# Patient Record
Sex: Female | Born: 1950 | Race: White | Hispanic: No | Marital: Married | Smoking: Former smoker
Health system: Southern US, Community
[De-identification: ages and names within clinical notes are randomized; demographics above are authoritative.]

## PROBLEM LIST (undated history)

## (undated) DIAGNOSIS — E049 Nontoxic goiter, unspecified: Secondary | ICD-10-CM

## (undated) DIAGNOSIS — N952 Postmenopausal atrophic vaginitis: Secondary | ICD-10-CM

## (undated) DIAGNOSIS — D219 Benign neoplasm of connective and other soft tissue, unspecified: Secondary | ICD-10-CM

## (undated) DIAGNOSIS — C801 Malignant (primary) neoplasm, unspecified: Secondary | ICD-10-CM

## (undated) DIAGNOSIS — N83209 Unspecified ovarian cyst, unspecified side: Secondary | ICD-10-CM

## (undated) HISTORY — DX: Benign neoplasm of connective and other soft tissue, unspecified: D21.9

## (undated) HISTORY — DX: Postmenopausal atrophic vaginitis: N95.2

## (undated) HISTORY — DX: Nontoxic goiter, unspecified: E04.9

## (undated) HISTORY — DX: Malignant (primary) neoplasm, unspecified: C80.1

## (undated) HISTORY — DX: Unspecified ovarian cyst, unspecified side: N83.209

## (undated) HISTORY — PX: OTHER SURGICAL HISTORY: SHX169

## (undated) HISTORY — PX: MOHS SURGERY: SUR867

---

## 1993-05-08 HISTORY — PX: OVARIAN CYST REMOVAL: SHX89

## 1993-05-08 HISTORY — PX: TOTAL ABDOMINAL HYSTERECTOMY: SHX209

## 1996-05-08 HISTORY — PX: OTHER SURGICAL HISTORY: SHX169

## 1997-11-04 ENCOUNTER — Ambulatory Visit (HOSPITAL_COMMUNITY): Admission: RE | Admit: 1997-11-04 | Discharge: 1997-11-04 | Payer: Self-pay | Admitting: Neurology

## 1998-07-21 ENCOUNTER — Other Ambulatory Visit: Admission: RE | Admit: 1998-07-21 | Discharge: 1998-07-21 | Payer: Self-pay | Admitting: Obstetrics and Gynecology

## 1998-08-15 ENCOUNTER — Emergency Department (HOSPITAL_COMMUNITY): Admission: EM | Admit: 1998-08-15 | Discharge: 1998-08-15 | Payer: Self-pay | Admitting: Internal Medicine

## 1999-06-28 ENCOUNTER — Other Ambulatory Visit: Admission: RE | Admit: 1999-06-28 | Discharge: 1999-06-28 | Payer: Self-pay | Admitting: Obstetrics and Gynecology

## 2000-06-11 ENCOUNTER — Other Ambulatory Visit: Admission: RE | Admit: 2000-06-11 | Discharge: 2000-06-11 | Payer: Self-pay | Admitting: Obstetrics and Gynecology

## 2001-06-11 ENCOUNTER — Other Ambulatory Visit: Admission: RE | Admit: 2001-06-11 | Discharge: 2001-06-11 | Payer: Self-pay | Admitting: Obstetrics and Gynecology

## 2002-07-08 ENCOUNTER — Other Ambulatory Visit: Admission: RE | Admit: 2002-07-08 | Discharge: 2002-07-08 | Payer: Self-pay | Admitting: Obstetrics and Gynecology

## 2003-07-21 ENCOUNTER — Other Ambulatory Visit: Admission: RE | Admit: 2003-07-21 | Discharge: 2003-07-21 | Payer: Self-pay | Admitting: Obstetrics and Gynecology

## 2004-07-27 ENCOUNTER — Other Ambulatory Visit: Admission: RE | Admit: 2004-07-27 | Discharge: 2004-07-27 | Payer: Self-pay | Admitting: Obstetrics and Gynecology

## 2004-09-07 ENCOUNTER — Ambulatory Visit (HOSPITAL_COMMUNITY): Admission: RE | Admit: 2004-09-07 | Discharge: 2004-09-07 | Payer: Self-pay | Admitting: Endocrinology

## 2004-09-20 ENCOUNTER — Encounter (HOSPITAL_COMMUNITY): Admission: RE | Admit: 2004-09-20 | Discharge: 2004-12-19 | Payer: Self-pay | Admitting: Endocrinology

## 2004-09-28 ENCOUNTER — Encounter: Admission: RE | Admit: 2004-09-28 | Discharge: 2004-09-28 | Payer: Self-pay | Admitting: Endocrinology

## 2004-09-28 ENCOUNTER — Other Ambulatory Visit: Admission: RE | Admit: 2004-09-28 | Discharge: 2004-09-28 | Payer: Self-pay | Admitting: Interventional Radiology

## 2004-09-28 ENCOUNTER — Encounter (INDEPENDENT_AMBULATORY_CARE_PROVIDER_SITE_OTHER): Payer: Self-pay | Admitting: Specialist

## 2005-04-03 ENCOUNTER — Ambulatory Visit (HOSPITAL_COMMUNITY): Admission: RE | Admit: 2005-04-03 | Discharge: 2005-04-03 | Payer: Self-pay | Admitting: Gastroenterology

## 2005-04-10 ENCOUNTER — Ambulatory Visit (HOSPITAL_COMMUNITY): Admission: RE | Admit: 2005-04-10 | Discharge: 2005-04-10 | Payer: Self-pay | Admitting: Endocrinology

## 2005-05-08 HISTORY — PX: THYROIDECTOMY: SHX17

## 2005-08-01 ENCOUNTER — Other Ambulatory Visit: Admission: RE | Admit: 2005-08-01 | Discharge: 2005-08-01 | Payer: Self-pay | Admitting: Obstetrics and Gynecology

## 2005-08-15 ENCOUNTER — Encounter (INDEPENDENT_AMBULATORY_CARE_PROVIDER_SITE_OTHER): Payer: Self-pay | Admitting: Specialist

## 2005-08-15 ENCOUNTER — Ambulatory Visit (HOSPITAL_COMMUNITY): Admission: RE | Admit: 2005-08-15 | Discharge: 2005-08-16 | Payer: Self-pay | Admitting: Surgery

## 2005-10-17 ENCOUNTER — Encounter: Admission: RE | Admit: 2005-10-17 | Discharge: 2005-10-17 | Payer: Self-pay | Admitting: Endocrinology

## 2005-10-26 ENCOUNTER — Encounter: Admission: RE | Admit: 2005-10-26 | Discharge: 2005-10-26 | Payer: Self-pay | Admitting: Endocrinology

## 2005-11-03 ENCOUNTER — Encounter: Admission: RE | Admit: 2005-11-03 | Discharge: 2005-11-03 | Payer: Self-pay | Admitting: Endocrinology

## 2006-08-03 ENCOUNTER — Other Ambulatory Visit: Admission: RE | Admit: 2006-08-03 | Discharge: 2006-08-03 | Payer: Self-pay | Admitting: Obstetrics and Gynecology

## 2006-11-21 ENCOUNTER — Encounter: Admission: RE | Admit: 2006-11-21 | Discharge: 2006-11-21 | Payer: Self-pay | Admitting: Endocrinology

## 2007-08-06 ENCOUNTER — Other Ambulatory Visit: Admission: RE | Admit: 2007-08-06 | Discharge: 2007-08-06 | Payer: Self-pay | Admitting: Obstetrics and Gynecology

## 2007-11-11 ENCOUNTER — Encounter: Admission: RE | Admit: 2007-11-11 | Discharge: 2007-11-11 | Payer: Self-pay | Admitting: Endocrinology

## 2007-11-12 ENCOUNTER — Encounter: Admission: RE | Admit: 2007-11-12 | Discharge: 2007-11-12 | Payer: Self-pay | Admitting: Endocrinology

## 2007-11-13 ENCOUNTER — Encounter: Admission: RE | Admit: 2007-11-13 | Discharge: 2007-11-13 | Payer: Self-pay | Admitting: Endocrinology

## 2008-08-11 ENCOUNTER — Ambulatory Visit: Payer: Self-pay | Admitting: Obstetrics and Gynecology

## 2008-08-11 ENCOUNTER — Other Ambulatory Visit: Admission: RE | Admit: 2008-08-11 | Discharge: 2008-08-11 | Payer: Self-pay | Admitting: Obstetrics and Gynecology

## 2008-08-11 ENCOUNTER — Encounter: Payer: Self-pay | Admitting: Obstetrics and Gynecology

## 2008-11-11 ENCOUNTER — Ambulatory Visit: Payer: Self-pay | Admitting: Obstetrics and Gynecology

## 2008-12-07 ENCOUNTER — Ambulatory Visit (HOSPITAL_COMMUNITY): Admission: RE | Admit: 2008-12-07 | Discharge: 2008-12-07 | Payer: Self-pay | Admitting: Obstetrics and Gynecology

## 2009-08-12 ENCOUNTER — Other Ambulatory Visit: Admission: RE | Admit: 2009-08-12 | Discharge: 2009-08-12 | Payer: Self-pay | Admitting: Obstetrics and Gynecology

## 2009-08-12 ENCOUNTER — Ambulatory Visit: Payer: Self-pay | Admitting: Obstetrics and Gynecology

## 2009-11-23 ENCOUNTER — Ambulatory Visit: Payer: Self-pay | Admitting: Obstetrics and Gynecology

## 2009-12-15 ENCOUNTER — Ambulatory Visit (HOSPITAL_COMMUNITY): Admission: RE | Admit: 2009-12-15 | Discharge: 2009-12-15 | Payer: Self-pay | Admitting: Obstetrics and Gynecology

## 2010-03-21 ENCOUNTER — Encounter (HOSPITAL_COMMUNITY)
Admission: RE | Admit: 2010-03-21 | Discharge: 2010-06-07 | Payer: Self-pay | Source: Home / Self Care | Attending: Endocrinology | Admitting: Endocrinology

## 2010-05-29 ENCOUNTER — Encounter: Payer: Self-pay | Admitting: Endocrinology

## 2010-09-02 ENCOUNTER — Encounter: Payer: Self-pay | Admitting: *Deleted

## 2010-09-02 DIAGNOSIS — N952 Postmenopausal atrophic vaginitis: Secondary | ICD-10-CM

## 2010-09-02 DIAGNOSIS — M810 Age-related osteoporosis without current pathological fracture: Secondary | ICD-10-CM | POA: Insufficient documentation

## 2010-09-05 ENCOUNTER — Encounter (INDEPENDENT_AMBULATORY_CARE_PROVIDER_SITE_OTHER): Payer: 59 | Admitting: Obstetrics and Gynecology

## 2010-09-05 ENCOUNTER — Other Ambulatory Visit (HOSPITAL_COMMUNITY)
Admission: RE | Admit: 2010-09-05 | Discharge: 2010-09-05 | Disposition: A | Discharge status: Home / Self Care | Payer: 59 | Source: Ambulatory Visit | Attending: Obstetrics and Gynecology | Admitting: Obstetrics and Gynecology

## 2010-09-05 ENCOUNTER — Other Ambulatory Visit: Payer: Self-pay | Admitting: Obstetrics and Gynecology

## 2010-09-05 DIAGNOSIS — Z124 Encounter for screening for malignant neoplasm of cervix: Secondary | ICD-10-CM | POA: Insufficient documentation

## 2010-09-05 DIAGNOSIS — E0590 Thyrotoxicosis, unspecified without thyrotoxic crisis or storm: Secondary | ICD-10-CM

## 2010-09-05 DIAGNOSIS — B373 Candidiasis of vulva and vagina: Secondary | ICD-10-CM

## 2010-09-05 DIAGNOSIS — Z01419 Encounter for gynecological examination (general) (routine) without abnormal findings: Secondary | ICD-10-CM

## 2010-09-05 DIAGNOSIS — Z13220 Encounter for screening for lipoid disorders: Secondary | ICD-10-CM

## 2010-09-06 ENCOUNTER — Encounter: Payer: Self-pay | Admitting: *Deleted

## 2010-09-23 NOTE — Op Note (Signed)
NAMEPeola, [name]                  ACCOUNT NO.:  [id number]   MEDICAL RECORD NO.:  [id number]          PATIENT TYPE:  OIB   LOCATION:  5739                         FACILITY:  [facility]   PHYSICIAN:  Velora Heckler, MD      DATE OF BIRTH:  [birth year]   DATE OF PROCEDURE:  08/15/2005  DATE OF DISCHARGE:                                 OPERATIVE REPORT   PREOPERATIVE DIAGNOSIS:  Thyroid goiter with compressive symptoms, dominant  left thyroid nodule.   POSTOPERATIVE DIAGNOSIS:  Thyroid goiter with compressive symptoms, dominant  left thyroid nodule.   PROCEDURE:  Total thyroidectomy.   SURGEON:  Velora Heckler, M.D., FACS   ASSISTANT:  Anselm Pancoast. Zachery Dakins, M.D., FACS   ANESTHESIA:  General per Dr. Kipp Brood   ESTIMATED BLOOD LOSS:  Minimal.   PREPARATION:  Betadine.   COMPLICATIONS:  None.   INDICATIONS:  The patient is a 60 year old white female from [place],  [place], referred by Dr. Dorisann Frames with thyroid goiter.  The  patient had been diagnosed in March 2006 by Dr. Edyth Gunnels.  The  patient had undergone a thorough workup.  The patient had continued  enlargement of the left thyroid lobe with development of compressive  symptoms.  She now comes to surgery for thyroidectomy.   DESCRIPTION OF PROCEDURE:  The procedure was done in OR2 in the  neurosurgical operating theater at [facility].  The patient is  brought to the operating room and placed in a supine position on the  operating room table.  Following the administration of general anesthesia,  the patient is positioned and then prepped and draped in the usual strict  aseptic fashion.  After ascertaining that an adequate level of anesthesia  been obtained, a Kocher incision was made with a #15 blade.  Dissection was  carried down through subcutaneous tissues and platysma.  Hemostasis was  obtained with electrocautery.  Skin flaps were developed cephalad and caudad  from the thyroid notch to  the sternal notch.  There is marked deviation of  the airway and larynx to the right due to the large mass in the left thyroid  bed.  A Mahorner self-retaining retractor was placed for exposure.  Strap  muscles were incised in the midline and dissection was begun on the left  side.  The strap muscles were reflected laterally.  The left thyroid lobe  was exposed.  It is quite large measuring at least 7-8 cm in greatest  dimension.  The superior pole vessels are ligated in continuity with 2-0  silk ties and medium Liga clips and divided.  The gland is rolled medially.  Venous tributaries were divided between medium Liga clips and ligated in  continuity with 2-0 silk ties and divided.  The gland is rolled further  anteriorly.  The recurrent nerve was identified and preserved.  The branches  of the inferior thyroid artery were divided between small and medium Liga  clips.  The ligament of Allyson Sabal is transected with the electrocautery and the  gland is rolled anteriorly up and onto the  anterior trachea.  A dry pack is  placed in the left neck with good hemostasis noted.   Next, we turned our attention to the right lobe.  The right lobe of the  gland was markedly smaller.  It is without significant nodularity.  The  strap muscles were reflected laterally.  The superior pole was dissected  out, the superior pole vessels were ligated in continuity with 2-0 silk ties  and medium Liga clips and divided.  Superior and inferior parathyroid glands  were identified on the right and preserved.  The recurrent nerve was  identified on the right and preserved.  The branches of the inferior thyroid  artery were divided between small hemoclips.  The inferior venous  tributaries were divided between medium hemoclips.  The gland is rolled  anteriorly.  The ligament of Allyson Sabal was transected with the electrocautery.  The gland was dissected off the anterior trachea and the entire specimen  excised.  It is submitted  to pathology.  The sutures were used to mark the  right thyroid lobe.  The neck is irrigated with warm saline which was  evacuated.  All packs were removed.  Hemostasis was obtained with small and  medium hemoclips.  Surgicel was placed over the area of the recurrent nerves  bilaterally.  The strap muscles were reapproximated in the midline with  interrupted 3-0 Vicryl sutures.  The platysma was closed with interrupted 3-  0 Vicryl sutures.  The skin was reapproximated with a running 4-0 Vicryl  subcuticular suture.  The wound is washed and dried and Benzoin and Steri-  Strips were applied.  Sterile dressings were applied.  The patient is  awakened from anesthesia and brought to the recovery room in stable  condition.  The patient tolerated the procedure well.      Velora Heckler, MD  Electronically Signed     TMG/MEDQ  D:  08/15/2005  T:  08/15/2005  Job:  [id number]   cc:   Reuel Boom L. Eda Paschal, M.D.  Fax: [phone]   Dorisann Frames, M.D.  Fax: [phone]

## 2010-09-23 NOTE — Op Note (Signed)
NAMEMileidy Davila, [name]                  ACCOUNT NO.:  [id number]   MEDICAL RECORD NO.:  [id number]          PATIENT TYPE:  AMB   LOCATION:  ENDO                         FACILITY:  [facility]   PHYSICIAN:  Anselmo Rod, M.D.  DATE OF BIRTH:  07-29-1950   DATE OF PROCEDURE:  DATE OF DISCHARGE:                                 OPERATIVE REPORT   Audio too short to transcribe (less than 5 seconds)      Anselmo Rod, M.D.     JNM/MEDQ  D:  04/03/2005  T:  04/03/2005  Job:  [id number]

## 2010-09-23 NOTE — Op Note (Signed)
NAMEChrystel, Barefield Muskan                  ACCOUNT NO.:  [id number]   MEDICAL RECORD NO.:  [id number]          PATIENT TYPE:  AMB   LOCATION:  ENDO                         FACILITY:  [facility]   PHYSICIAN:  Anselmo Rod, M.D.  DATE OF BIRTH:  [birth year]   DATE OF PROCEDURE:  04/03/2005  DATE OF DISCHARGE:                                 OPERATIVE REPORT   PROCEDURE PERFORMED:  Screening coloscopy.   ENDOSCOPIST:  Anselmo Rod, M.D.   INSTRUMENT USED:  Olympus video colonoscope.   INDICATIONS FOR PROCEDURE:  A 60 year old white female undergoing a  screening coloscopy to rule out colonic polyps, masses, etc.  The patient  has a family history of colon cancer in her father who died of it at age 72.   PRE-PROCEDURE PREPARATION:  Informed consent was procured from the patient.  The patient was fasted for four hours prior to the procedure and prepped  with OsmoPrep pills the night prior to and the morning of the procedure.  The risks and benefits of the procedure including a 10% mis-read of cancer  and polyps was discussed with the patient as well.   PRE-PROCEDURE PHYSICAL:  The patient had stable vital signs.  Neck supple.  Chest clear to auscultation.  S1 S2 regular.  Abdomen soft with normal bowel  sounds.   DESCRIPTION OF THE PROCEDURE:  The patient was placed in the left lateral  decubitus position and sedated with 75 mg of Demerol and 5 mg of Versed in  slow incremental doses.  Once the patient was adequately sedated and  maintained on low flow oxygen and continuous cardiac monitoring, the Olympus  video colonoscope was advanced from the rectum to the cecum.  There was  residual stool in the colon.  Multiple washes were done.  The appendiceal  orifice and ileocecal valve were clearly visualized and photographed.  No  masses, polyps, erosions, ulcerations or diverticula were seen.  Retroflexion in the rectum revealed no abnormalities.  The patient tolerated  the procedure well  without complications.   IMPRESSION:  Normal colonoscopy to the terminal ileum.  No masses, polyps or  diverticula seen.   RECOMMENDATIONS:  1.  Continue a high fiber diet with liberal fluid intake.  2.  Repeat colonoscopy in the next five years unless the patient develops      abnormal symptoms.  3.  The patient will follow as the need arises in the future.      Anselmo Rod, M.D.  Electronically Signed     JNM/MEDQ  D:  04/03/2005  T:  04/03/2005  Job:  [id number]   cc:   Reuel Boom L. Eda Paschal, M.D.  Fax: [phone]

## 2010-12-06 ENCOUNTER — Telehealth: Payer: Self-pay | Admitting: *Deleted

## 2010-12-06 DIAGNOSIS — M810 Age-related osteoporosis without current pathological fracture: Secondary | ICD-10-CM

## 2010-12-06 NOTE — Telephone Encounter (Signed)
[name]. Would be a solution for her please check benefits.

## 2010-12-06 NOTE — Telephone Encounter (Signed)
Called patient and LM stating ok to check benefits for Prolia.  Will process and call patient back when benefits are received.

## 2010-12-06 NOTE — Telephone Encounter (Signed)
Called patient to give her the benefits for Reclast.  She had new insurance and it would not pay anything.  Everything would go toward deductable.  Patient asked if there may be a cheaper option.  I mentioned Prolia to her and she would like to know if this would be an option for her? If so, we can check benefits for her on that.

## 2010-12-14 NOTE — Telephone Encounter (Signed)
Patient was informed about Prolia benefits. She is covered with a $25 copay.  We will schedule her labs, order Prolia, and call patient when results are in and injection arrives.

## 2010-12-15 ENCOUNTER — Other Ambulatory Visit: Payer: BC Managed Care – PPO | Admitting: *Deleted

## 2010-12-15 DIAGNOSIS — M810 Age-related osteoporosis without current pathological fracture: Secondary | ICD-10-CM

## 2010-12-15 LAB — CALCIUM: Calcium: 10 mg/dL (ref 8.4–10.5)

## 2010-12-15 LAB — CREATININE, SERUM: Creat: 0.83 mg/dL (ref 0.50–1.10)

## 2010-12-19 ENCOUNTER — Telehealth: Payer: Self-pay | Admitting: *Deleted

## 2010-12-19 ENCOUNTER — Other Ambulatory Visit: Payer: Self-pay | Admitting: *Deleted

## 2010-12-19 DIAGNOSIS — M810 Age-related osteoporosis without current pathological fracture: Secondary | ICD-10-CM

## 2010-12-19 MED ORDER — DENOSUMAB 60 MG/ML ~~LOC~~ SOLN
60.0000 mg | Freq: Once | SUBCUTANEOUS | Status: AC
  Administered 2010-12-20: 60 mg via SUBCUTANEOUS

## 2010-12-19 NOTE — Telephone Encounter (Signed)
Called patient and told her that her labs were normal, and we will proceed with Prolia injection at a $25 copay.

## 2010-12-20 ENCOUNTER — Ambulatory Visit (INDEPENDENT_AMBULATORY_CARE_PROVIDER_SITE_OTHER): Payer: BC Managed Care – PPO | Admitting: Gynecology

## 2010-12-20 DIAGNOSIS — M810 Age-related osteoporosis without current pathological fracture: Secondary | ICD-10-CM

## 2011-06-20 ENCOUNTER — Telehealth: Payer: Self-pay | Admitting: *Deleted

## 2011-06-20 NOTE — Telephone Encounter (Signed)
Lm for patient to call.  Time for Prolia injection.  Want to make sure patient still interested before ordering

## 2011-06-21 ENCOUNTER — Other Ambulatory Visit: Payer: Self-pay | Admitting: *Deleted

## 2011-06-21 DIAGNOSIS — M810 Age-related osteoporosis without current pathological fracture: Secondary | ICD-10-CM

## 2011-06-21 MED ORDER — DENOSUMAB 60 MG/ML ~~LOC~~ SOLN: 60.0000 mg | Freq: Once | SUBCUTANEOUS | Status: DC

## 2011-06-21 NOTE — Telephone Encounter (Signed)
Patient will come for Prolia.  Ordered today and will get injection on 06/28/11.

## 2011-06-28 ENCOUNTER — Ambulatory Visit (INDEPENDENT_AMBULATORY_CARE_PROVIDER_SITE_OTHER): Payer: BC Managed Care – PPO | Admitting: Anesthesiology

## 2011-06-28 DIAGNOSIS — M810 Age-related osteoporosis without current pathological fracture: Secondary | ICD-10-CM

## 2011-06-28 MED ORDER — DENOSUMAB 60 MG/ML ~~LOC~~ SOLN
60.0000 mg | Freq: Once | SUBCUTANEOUS | Status: AC
  Administered 2011-06-28: 60 mg via SUBCUTANEOUS

## 2011-09-04 ENCOUNTER — Encounter: Payer: Self-pay | Admitting: Gynecology

## 2011-09-04 DIAGNOSIS — N83209 Unspecified ovarian cyst, unspecified side: Secondary | ICD-10-CM | POA: Insufficient documentation

## 2011-09-15 ENCOUNTER — Encounter: Payer: Self-pay | Admitting: Obstetrics and Gynecology

## 2011-09-15 ENCOUNTER — Ambulatory Visit (INDEPENDENT_AMBULATORY_CARE_PROVIDER_SITE_OTHER): Payer: BC Managed Care – PPO | Admitting: Obstetrics and Gynecology

## 2011-09-15 VITALS — BP 124/72 | Ht 61.5 in | Wt 136.0 lb

## 2011-09-15 DIAGNOSIS — E049 Nontoxic goiter, unspecified: Secondary | ICD-10-CM

## 2011-09-15 DIAGNOSIS — Z01419 Encounter for gynecological examination (general) (routine) without abnormal findings: Secondary | ICD-10-CM

## 2011-09-15 NOTE — Progress Notes (Addendum)
The patient came to see me today for her annual GYN exam. She does have some hot flashes but she can control them with her diet. She treat her vaginal dryness with estrogen cream. She has a history of osteoporosis. Initially she was treated with Fosamax. She then did 2 years of Reclast. Due to cost  she's now done one-year Prolia. Her last bone density was in 2012 and her worst T score was now -2.1. It showed 10.1% improvement in the spine and 4.7% improvement in the hip. She has had no fractures. She is due for her mammogram next month. She is having no vaginal bleeding. She is having no pelvic pain. Her endocrinologist watches her goiter with Synthroid. Her cholesterol was fine last year.  HEENT: Within normal limits. Beola Cord present. Neck: No masses. Supraclavicular lymph nodes: Not enlarged. Breasts: Examined in both sitting and lying position. Symmetrical without skin changes or masses. Abdomen: Soft no masses guarding or rebound. No hernias. Pelvic: External within normal limits. BUS within normal limits. Vaginal examination shows good estrogen effect, no cystocele enterocele or rectocele. Cervix and uterus absent. Adnexa within normal limits. Rectovaginal confirmatory. Extremities within normal limits.  Assessment: #1. Atrophic vaginitis #2. Mild menopausal symptoms #3. Osteoporosis now improved  #4. Goiter on Synthroid.  Plan: We discussed some of the new menopausal drugs. For the moment she'll just continue estrogen cream. We discussed pros and cons of continuing Prolia. We have elected to do one more year. We will then get a bone density next year. Mammogram in June.

## 2011-09-20 ENCOUNTER — Other Ambulatory Visit: Payer: Self-pay | Admitting: *Deleted

## 2011-09-20 MED ORDER — HYDROCHLOROTHIAZIDE 25 MG PO TABS: 25.0000 mg | ORAL_TABLET | Freq: Every day | ORAL | Status: DC

## 2011-10-19 ENCOUNTER — Encounter: Payer: Self-pay | Admitting: Obstetrics and Gynecology

## 2011-12-25 ENCOUNTER — Telehealth: Payer: Self-pay | Admitting: *Deleted

## 2011-12-25 NOTE — Telephone Encounter (Signed)
Contacted patient about Prolia being due.  Her last calcium was checked August 2012.  Do you want [place] to check Calcium before injection?

## 2011-12-25 NOTE — Telephone Encounter (Signed)
no

## 2011-12-26 ENCOUNTER — Other Ambulatory Visit: Payer: Self-pay | Admitting: *Deleted

## 2011-12-26 DIAGNOSIS — M810 Age-related osteoporosis without current pathological fracture: Secondary | ICD-10-CM

## 2011-12-26 MED ORDER — DENOSUMAB 60 MG/ML ~~LOC~~ SOLN
60.0000 mg | Freq: Once | SUBCUTANEOUS | Status: AC
  Administered 2012-01-01: 60 mg via SUBCUTANEOUS

## 2011-12-26 NOTE — Telephone Encounter (Signed)
Lm for patient.  Informed dont need labs done.  Ordered Prolia and will set up injection time.

## 2012-01-01 ENCOUNTER — Ambulatory Visit (INDEPENDENT_AMBULATORY_CARE_PROVIDER_SITE_OTHER): Payer: BC Managed Care – PPO | Admitting: Gynecology

## 2012-01-01 DIAGNOSIS — M810 Age-related osteoporosis without current pathological fracture: Secondary | ICD-10-CM

## 2012-05-19 ENCOUNTER — Other Ambulatory Visit: Payer: Self-pay | Admitting: Obstetrics and Gynecology

## 2012-05-21 ENCOUNTER — Other Ambulatory Visit: Payer: Self-pay | Admitting: Obstetrics and Gynecology

## 2012-09-16 ENCOUNTER — Encounter: Payer: Self-pay | Admitting: Gynecology

## 2012-09-16 ENCOUNTER — Ambulatory Visit (INDEPENDENT_AMBULATORY_CARE_PROVIDER_SITE_OTHER): Payer: BC Managed Care – PPO | Admitting: Gynecology

## 2012-09-16 VITALS — BP 112/78 | Ht 61.5 in | Wt 138.0 lb

## 2012-09-16 DIAGNOSIS — IMO0002 Reserved for concepts with insufficient information to code with codable children: Secondary | ICD-10-CM

## 2012-09-16 DIAGNOSIS — Z85850 Personal history of malignant neoplasm of thyroid: Secondary | ICD-10-CM

## 2012-09-16 DIAGNOSIS — Z8639 Personal history of other endocrine, nutritional and metabolic disease: Secondary | ICD-10-CM

## 2012-09-16 DIAGNOSIS — M810 Age-related osteoporosis without current pathological fracture: Secondary | ICD-10-CM

## 2012-09-16 DIAGNOSIS — N952 Postmenopausal atrophic vaginitis: Secondary | ICD-10-CM

## 2012-09-16 DIAGNOSIS — Z23 Encounter for immunization: Secondary | ICD-10-CM

## 2012-09-16 DIAGNOSIS — L293 Anogenital pruritus, unspecified: Secondary | ICD-10-CM

## 2012-09-16 DIAGNOSIS — Z01419 Encounter for gynecological examination (general) (routine) without abnormal findings: Secondary | ICD-10-CM

## 2012-09-16 DIAGNOSIS — N898 Other specified noninflammatory disorders of vagina: Secondary | ICD-10-CM

## 2012-09-16 LAB — COMPREHENSIVE METABOLIC PANEL
Albumin: 4.4 g/dL (ref 3.5–5.2)
Alkaline Phosphatase: 47 U/L (ref 39–117)
BUN: 19 mg/dL (ref 6–23)
CO2: 28 mEq/L (ref 19–32)
Calcium: 9.3 mg/dL (ref 8.4–10.5)
Chloride: 104 mEq/L (ref 96–112)
Glucose, Bld: 84 mg/dL (ref 70–99)
Potassium: 3.8 mEq/L (ref 3.5–5.3)
Sodium: 140 mEq/L (ref 135–145)
Total Protein: 6.3 g/dL (ref 6.0–8.3)

## 2012-09-16 LAB — LIPID PANEL
Cholesterol: 220 mg/dL — ABNORMAL HIGH (ref 0–200)
HDL: 69 mg/dL (ref >39)
Total CHOL/HDL Ratio: 3.2 Ratio
Triglycerides: 86 mg/dL (ref <150)
VLDL: 17 mg/dL (ref 0–40)

## 2012-09-16 LAB — CBC WITH DIFFERENTIAL/PLATELET
Basophils Relative: 1 % (ref 0–1)
HCT: 40.4 % (ref 36.0–46.0)
Hemoglobin: 14.3 g/dL (ref 12.0–15.0)
Lymphocytes Relative: 18 % (ref 12–46)
Lymphs Abs: 1 10*3/uL (ref 0.7–4.0)
MCHC: 35.4 g/dL (ref 30.0–36.0)
Monocytes Absolute: 0.5 10*3/uL (ref 0.1–1.0)
Monocytes Relative: 10 % (ref 3–12)
Neutro Abs: 3.7 10*3/uL (ref 1.7–7.7)
Neutrophils Relative %: 70 % (ref 43–77)
RBC: 4.57 MIL/uL (ref 3.87–5.11)

## 2012-09-16 LAB — WET PREP FOR TRICH, YEAST, CLUE
Trich, Wet Prep: NONE SEEN
Yeast Wet Prep HPF POC: NONE SEEN

## 2012-09-16 MED ORDER — CLINDAMYCIN PHOSPHATE 2 % VA CREA: 1.0000 | TOPICAL_CREAM | Freq: Every day | VAGINAL | Status: AC

## 2012-09-16 MED ORDER — OSPEMIFENE 60 MG PO TABS: 60.0000 mg | ORAL_TABLET | Freq: Every day | ORAL | Status: DC

## 2012-09-16 NOTE — Progress Notes (Addendum)
[name] 1951-01-13 [id number]   History:    62 y.o.  for annual gyn exam with history of total abdominal hysterectomy with left ovarian cystectomy who has history of osteoporosis. Review of patient's records indicated that for 2 years she had been on Fosamax and then was switched over to reach last IV and had to discontinue it because of cost and she's now been on Prolia for 2 years. She missed her last dose and was wondering if she needed to continue on it or not. Patient is taking her calcium and vitamin D. Her last colonoscopy was 3 years ago by Dr. Loreta Ave and was reportedly normal. Her father had colon cancer so patient is only 5 years cycle. Her last mammogram was normal in 2013. Patient denies any prior history of abnormal Pap smears. Her last Pap smear was in 2012. Patient was having some slight vaginal irritation but no true discharge. She is being followed by Dr. Lurene Shadow endocrinologist for her hypothyroidism. 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  Past medical history,surgical history, family history and social history were all reviewed and documented in the EPIC chart.  Gynecologic History No LMP recorded. Patient has had a hysterectomy. Contraception: post menopausal status Last Pap: 2012. Results were: normal Last mammogram: 2013. Results were: normal  Obstetric History OB History   Grav Para Term Preterm Abortions TAB SAB Ect Mult Living   0                ROS: A ROS was performed and pertinent positives and negatives are included in the history.  GENERAL: No fevers or chills. HEENT: No change in vision, no earache, sore throat or sinus congestion. NECK: No pain or stiffness. CARDIOVASCULAR: No chest pain or pressure. No palpitations. PULMONARY: No shortness of breath, cough or wheeze. GASTROINTESTINAL: No abdominal pain, nausea, vomiting or diarrhea,  melena or bright red blood per rectum. GENITOURINARY: No urinary frequency, urgency, hesitancy or dysuria. MUSCULOSKELETAL: No joint or muscle pain, no back pain, no recent trauma. DERMATOLOGIC: No rash, no itching, no lesions. ENDOCRINE: No polyuria, polydipsia, no heat or cold intolerance. No recent change in weight. HEMATOLOGICAL: No anemia or easy bruising or bleeding. NEUROLOGIC: No headache, seizures, numbness, tingling or weakness. PSYCHIATRIC: No depression, no loss of interest in normal activity or change in sleep pattern.     Exam: chaperone present  BP 112/78  Ht 5' 1.5" (1.562 m)  Wt 138 lb (62.596 kg)  BMI 25.66 kg/m2  Body mass index is 25.66 kg/(m^2).  General appearance : Well developed well nourished female. No acute distress HEENT: Neck supple, trachea midline, no carotid bruits, no thyroidmegaly Lungs: Clear to auscultation, no rhonchi or wheezes, or rib retractions  Heart: Regular rate and rhythm, no murmurs or gallops Breast:Examined in sitting and supine position were symmetrical in appearance, no palpable masses or tenderness,  no skin retraction, no nipple inversion, no nipple discharge, no skin discoloration, no axillary or supraclavicular lymphadenopathy Abdomen: no palpable masses or tenderness, no rebound or guarding Extremities: no edema or skin discoloration or tenderness  Pelvic:  Bartholin, Urethra, Skene Glands: Within normal limits             Vagina: No gross lesions or discharge,atrophic changes  Cervix:absent  Uterusabsent  Adnexa  Without masses or tenderness  Anus and perineum  normal   Rectovaginal  normal sphincter tone without palpated masses or tenderness  Hemoccult Card provided     Assessment/Plan:  62 y.o. female for annual exam was having some slight vaginal irritation. Her wet prep demonstrated bacteria only a few WBC. A prescription for Cleocin vaginal cream to apply each bedtime for 5 days was provided. Patient will  schedule her bone density study Schedule an appointment to see me the week after to review their report and determine if she can go on a drug-free holiday. She was given Hemoccult card to submit to the office for testing. We are going to check her vitamin D level today along with a CBC, comprehensive metabolic panel, lipid profile and urinalysis. No Pap smear done today the new guidelines discussed. Patient wanted to go off the topical vaginal estrogen twice a week which she was using to help her with the vaginal dryness and dyspareunia. We had a discussion on Osphena and discussed the risks benefits and pros and cons. There appears to be no contraindication. She will be prescribed 60 mg daily. We discussed importance of calcium vitamin D and regular exercise. Dr. Lurene Shadow will be checking her thyroid function test. Dr. Molly Maduro Read is her primary physician.    Ok Edwards MD, 3:37 PM 09/16/2012

## 2012-09-16 NOTE — Patient Instructions (Addendum)

## 2012-09-17 LAB — URINALYSIS W MICROSCOPIC + REFLEX CULTURE
Bacteria, UA: NONE SEEN
Bilirubin Urine: NEGATIVE
Glucose, UA: NEGATIVE mg/dL
Hgb urine dipstick: NEGATIVE
Ketones, ur: NEGATIVE mg/dL
Protein, ur: NEGATIVE mg/dL
Urobilinogen, UA: 0.2 mg/dL (ref 0.0–1.0)

## 2012-09-17 LAB — VITAMIN D 25 HYDROXY (VIT D DEFICIENCY, FRACTURES): Vit D, 25-Hydroxy: 64 ng/mL (ref 30–89)

## 2012-10-14 ENCOUNTER — Other Ambulatory Visit: Payer: Self-pay | Admitting: *Deleted

## 2012-10-14 DIAGNOSIS — M8580 Other specified disorders of bone density and structure, unspecified site: Secondary | ICD-10-CM

## 2012-10-29 ENCOUNTER — Encounter: Payer: Self-pay | Admitting: Gynecology

## 2012-11-07 ENCOUNTER — Other Ambulatory Visit: Payer: Self-pay | Admitting: *Deleted

## 2012-11-07 DIAGNOSIS — M8580 Other specified disorders of bone density and structure, unspecified site: Secondary | ICD-10-CM

## 2012-11-17 ENCOUNTER — Other Ambulatory Visit: Payer: Self-pay | Admitting: Gynecology

## 2012-11-18 NOTE — Telephone Encounter (Signed)
Please check to see who has per prescribed her HCTZ before.

## 2012-11-26 ENCOUNTER — Ambulatory Visit (INDEPENDENT_AMBULATORY_CARE_PROVIDER_SITE_OTHER): Payer: BC Managed Care – PPO | Admitting: Gynecology

## 2012-11-26 ENCOUNTER — Encounter: Payer: Self-pay | Admitting: Gynecology

## 2012-11-26 VITALS — BP 116/72

## 2012-11-26 DIAGNOSIS — Z79890 Hormone replacement therapy (postmenopausal): Secondary | ICD-10-CM

## 2012-11-26 DIAGNOSIS — N952 Postmenopausal atrophic vaginitis: Secondary | ICD-10-CM

## 2012-11-26 DIAGNOSIS — M8580 Other specified disorders of bone density and structure, unspecified site: Secondary | ICD-10-CM

## 2012-11-26 DIAGNOSIS — M949 Disorder of cartilage, unspecified: Secondary | ICD-10-CM

## 2012-11-26 DIAGNOSIS — M899 Disorder of bone, unspecified: Secondary | ICD-10-CM

## 2012-11-26 MED ORDER — NONFORMULARY OR COMPOUNDED ITEM: Status: DC

## 2012-11-26 NOTE — Progress Notes (Signed)
Patient presented to the office today to discuss her recent bone density study. Patient with past history of osteoporosis. Patient's mother also had a history of osteoporosis. Review of patient's records indicated that for 2 years she had been on Fosamax and then was switched over to  Reclast  IV and had to discontinue it because of cost and she's now been on Prolia for 2 years. She missed her last dose and was wondering if she needed to continue on it or not. Patient is taking her calcium and vitamin D. Recent blood work that demonstrated the patient's vitamin D and calcium levels were normal. Patient also was complaining of vaginal irritation dryness and she was placed on Osphena did state that it made her hot flashes worse and that she wanted to go back and take the estrogen vaginal cream twice a week.  Patient's bone density study was reviewed from another facility. In comparison with 2 years prior that had documented that she had a 4.7% improvement of the AP spine. (A T score of -1.7). Her right femoral neck T score was -1.3 and left femoral neck -1.0 with no significant change reported. Patient's lastProlia was last August.  Assessment/plan: Patient with past history of osteoporosis has responded to anti-resultant agent as well as monoclonal antibody over the course of the past 4 years. Patient will be a candidate to undergo drug holiday. We will continue to follow her bone density studies every 2 years. She will continue her calcium and vitamin D and weightbearing exercises. Prescription for estradiol 0.02% vaginal application twice a week was provided. The patient's Foley where the risks benefits and pros and cons of hormone replacement therapy. Patient with prior history of total abdominal hysterectomy and left ovarian cystectomy.

## 2013-03-14 ENCOUNTER — Other Ambulatory Visit: Payer: Self-pay | Admitting: Gynecology

## 2013-03-15 NOTE — Telephone Encounter (Signed)
I looked at my previous notes and did not see ever having prescribed HCTZ to this patient. Was it her primary? Was Dr. Reece Agar ( Idid not se that in his last note)? Let me know JF

## 2013-03-19 ENCOUNTER — Telehealth: Payer: Self-pay | Admitting: *Deleted

## 2013-03-19 NOTE — Telephone Encounter (Signed)
Pt HCTZ 25 mg was denied 03/15/13 you have approved this Rx refill for pt on 11/17/12. Pt said Dr.G gave her this because he read that it help metabolize the calcium and help with her osteoporosis. I explained to pt that you may not fill this medication for her due to the type of medication. Please advise

## 2013-03-20 MED ORDER — HYDROCHLOROTHIAZIDE 25 MG PO TABS: ORAL_TABLET | ORAL | Status: DC

## 2013-03-20 NOTE — Telephone Encounter (Signed)
rx sent, pt informed.  

## 2013-03-20 NOTE — Telephone Encounter (Signed)
Please call a prescription for HCTZ 25 mg to take 1 by mouth daily. #30 refill 11. Make sure that patient eats plenty of fruits and vegetables to prevent hypokalemia

## 2013-06-04 ENCOUNTER — Other Ambulatory Visit: Payer: Self-pay | Admitting: Dermatology

## 2013-10-06 ENCOUNTER — Encounter: Payer: BC Managed Care – PPO | Admitting: Gynecology

## 2013-10-13 ENCOUNTER — Other Ambulatory Visit: Payer: Self-pay | Admitting: Gynecology

## 2013-10-13 NOTE — Telephone Encounter (Signed)
She is overdue for her annual exam

## 2013-10-13 NOTE — Telephone Encounter (Signed)
RGCE scheduled 10/15/13.

## 2013-10-15 ENCOUNTER — Encounter: Payer: BC Managed Care – PPO | Admitting: Gynecology

## 2013-10-16 ENCOUNTER — Other Ambulatory Visit (HOSPITAL_COMMUNITY)
Admission: RE | Admit: 2013-10-16 | Discharge: 2013-10-16 | Disposition: A | Discharge status: Home / Self Care | Payer: Medicare Other | Source: Ambulatory Visit | Attending: Gynecology | Admitting: Gynecology

## 2013-10-16 ENCOUNTER — Ambulatory Visit (INDEPENDENT_AMBULATORY_CARE_PROVIDER_SITE_OTHER): Payer: Medicare Other | Admitting: Gynecology

## 2013-10-16 ENCOUNTER — Encounter: Payer: Self-pay | Admitting: Gynecology

## 2013-10-16 VITALS — BP 124/80 | Ht 61.25 in | Wt 136.0 lb

## 2013-10-16 DIAGNOSIS — N760 Acute vaginitis: Secondary | ICD-10-CM

## 2013-10-16 DIAGNOSIS — B9689 Other specified bacterial agents as the cause of diseases classified elsewhere: Secondary | ICD-10-CM

## 2013-10-16 DIAGNOSIS — B373 Candidiasis of vulva and vagina: Secondary | ICD-10-CM

## 2013-10-16 DIAGNOSIS — Z1272 Encounter for screening for malignant neoplasm of vagina: Secondary | ICD-10-CM

## 2013-10-16 DIAGNOSIS — N898 Other specified noninflammatory disorders of vagina: Secondary | ICD-10-CM

## 2013-10-16 DIAGNOSIS — Z1151 Encounter for screening for human papillomavirus (HPV): Secondary | ICD-10-CM | POA: Insufficient documentation

## 2013-10-16 DIAGNOSIS — Z124 Encounter for screening for malignant neoplasm of cervix: Secondary | ICD-10-CM | POA: Insufficient documentation

## 2013-10-16 DIAGNOSIS — A499 Bacterial infection, unspecified: Secondary | ICD-10-CM

## 2013-10-16 DIAGNOSIS — B3731 Acute candidiasis of vulva and vagina: Secondary | ICD-10-CM

## 2013-10-16 DIAGNOSIS — N952 Postmenopausal atrophic vaginitis: Secondary | ICD-10-CM

## 2013-10-16 DIAGNOSIS — M810 Age-related osteoporosis without current pathological fracture: Secondary | ICD-10-CM

## 2013-10-16 LAB — WET PREP FOR TRICH, YEAST, CLUE: TRICH WET PREP: NONE SEEN

## 2013-10-16 MED ORDER — METRONIDAZOLE 500 MG PO TABS: 500.0000 mg | ORAL_TABLET | Freq: Two times a day (BID) | ORAL | Status: AC

## 2013-10-16 MED ORDER — NONFORMULARY OR COMPOUNDED ITEM: Status: AC

## 2013-10-16 MED ORDER — FLUCONAZOLE 150 MG PO TABS: 150.0000 mg | ORAL_TABLET | Freq: Once | ORAL | Status: AC

## 2013-10-16 MED ORDER — HYDROCHLOROTHIAZIDE 25 MG PO TABS: 25.0000 mg | ORAL_TABLET | Freq: Every day | ORAL | Status: AC

## 2013-10-16 NOTE — Patient Instructions (Signed)
Bacterial Vaginosis Bacterial vaginosis is a vaginal infection that occurs when the normal balance of bacteria in the vagina is disrupted. It results from an overgrowth of certain bacteria. This is the most common vaginal infection in women of childbearing age. Treatment is important to prevent complications, especially in pregnant women, as it can cause a premature delivery. CAUSES  Bacterial vaginosis is caused by an increase in harmful bacteria that are normally present in smaller amounts in the vagina. Several different kinds of bacteria can cause bacterial vaginosis. However, the reason that the condition develops is not fully understood. RISK FACTORS Certain activities or behaviors can put you at an increased risk of developing bacterial vaginosis, including:  Having a new sex partner or multiple sex partners.  Douching.  Using an intrauterine device (IUD) for contraception. Women do not get bacterial vaginosis from toilet seats, bedding, swimming pools, or contact with objects around them. SIGNS AND SYMPTOMS  Some women with bacterial vaginosis have no signs or symptoms. Common symptoms include:  Grey vaginal discharge.  A fishlike odor with discharge, especially after sexual intercourse.  Itching or burning of the vagina and vulva.  Burning or pain with urination. DIAGNOSIS  Your health care provider will take a medical history and examine the vagina for signs of bacterial vaginosis. A sample of vaginal fluid may be taken. Your health care provider will look at this sample under a microscope to check for bacteria and abnormal cells. A vaginal pH test may also be done.  TREATMENT  Bacterial vaginosis may be treated with antibiotic medicines. These may be given in the form of a pill or a vaginal cream. A second round of antibiotics may be prescribed if the condition comes back after treatment.  HOME CARE INSTRUCTIONS   Only take over-the-counter or prescription medicines as  directed by your health care provider.  If antibiotic medicine was prescribed, take it as directed. Make sure you finish it even if you start to feel better.  Do not have sex until treatment is completed.  Tell all sexual partners that you have a vaginal infection. They should see their health care provider and be treated if they have problems, such as a mild rash or itching.  Practice safe sex by using condoms and only having one sex partner. SEEK MEDICAL CARE IF:   Your symptoms are not improving after 3 days of treatment.  You have increased discharge or pain.  You have a fever. MAKE SURE YOU:   Understand these instructions.  Will watch your condition.  Will get help right away if you are not doing well or get worse. FOR MORE INFORMATION  Centers for Disease Control and Prevention, Division of STD Prevention: [employer]raiser[employer]raud[employer] American Sexual Health Association (ASHA): www.ashastd.org  Document Released: 04/24/2005 Document Revised: 02/12/2013 Document Reviewed: 12/04/2012 [facility] Patient Information 2014 [place]. Monilial Vaginitis Vaginitis in a soreness, swelling and redness (inflammation) of the vagina and vulva. Monilial vaginitis is not a sexually transmitted infection. CAUSES  Yeast vaginitis is caused by yeast (candida) that is normally found in your vagina. With a yeast infection, the candida has overgrown in number to a point that upsets the chemical balance. SYMPTOMS   White, thick vaginal discharge.  Swelling, itching, redness and irritation of the vagina and possibly the lips of the vagina (vulva).  Burning or painful urination.  Painful intercourse. DIAGNOSIS  Things that may contribute to monilial vaginitis are:  Postmenopausal and virginal states.  Pregnancy.  Infections.  Being tired, sick or stressed,  especially if you had monilial vaginitis in the past.  Diabetes. Good control will help lower the chance.  Birth control  pills.  Tight fitting garments.  Using bubble bath, feminine sprays, douches or deodorant tampons.  Taking certain medications that kill germs (antibiotics).  Sporadic recurrence can occur if you become ill. TREATMENT  Your caregiver will give you medication.  There are several kinds of anti monilial vaginal creams and suppositories specific for monilial vaginitis. For recurrent yeast infections, use a suppository or cream in the vagina 2 times a week, or as directed.  Anti-monilial or steroid cream for the itching or irritation of the vulva may also be used. Get your caregiver's permission.  Painting the vagina with methylene blue solution may help if the monilial cream does not work.  Eating yogurt may help prevent monilial vaginitis. HOME CARE INSTRUCTIONS   Finish all medication as prescribed.  Do not have sex until treatment is completed or after your caregiver tells you it is okay.  Take warm sitz baths.  Do not douche.  Do not use tampons, especially scented ones.  Wear cotton underwear.  Avoid tight pants and panty hose.  Tell your sexual partner that you have a yeast infection. They should go to their caregiver if they have symptoms such as mild rash or itching.  Your sexual partner should be treated as well if your infection is difficult to eliminate.  Practice safer sex. Use condoms.  Some vaginal medications cause latex condoms to fail. Vaginal medications that harm condoms are:  Cleocin cream.  Butoconazole (Femstat).  Terconazole (Terazol) vaginal suppository.  Miconazole (Monistat) (may be purchased over the counter). SEEK MEDICAL CARE IF:   You have a temperature by mouth above 102 F (38.9 C).  The infection is getting worse after 2 days of treatment.  The infection is not getting better after 3 days of treatment.  You develop blisters in or around your vagina.  You develop vaginal bleeding, and it is not your menstrual period.  You  have pain when you urinate.  You develop intestinal problems.  You have pain with sexual intercourse. Document Released: 02/01/2005 Document Revised: 07/17/2011 Document Reviewed: 10/16/2008 [facility] Patient Information 2014 [place].

## 2013-10-16 NOTE — Progress Notes (Signed)
[name] Apr 06, 1951 [id number]   History:    63 y.o.  for GYN followup exam. Patient with past history of total abdominal hysterectomy with left ovarian cystectomy who has history of osteoporosis.Review of patient's records indicated that for 2 years she had been on Fosamax and then was switched over to reach last IV and had to discontinue it because of cost. She was on Prolia for 2 years. She is now on a drug called today.  Her bone density study from 2014 was reviewed from another facility. In comparison with 2 years prior that had documented that she had a 4.7% improvement of the AP spine. (A T score of -1.7). Her right femoral neck T score was -1.3 and left femoral neck -1.0 with no significant change reported. Patient's lastProlia was last August 2013. She is currently taking her calcium and vitamin D and exercise irregularly.  Patient with no prior history of abnormal Pap smear. The patient is on vaginal estrogen twice a week for vaginal atrophy and has done well.She is being followed by Dr. Bubba Camp endocrinologist for her hypothyroidism. 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 1998 patient had a right Bartholin gland marsupialization.    Past medical history,surgical history, family history and social history were all reviewed and documented in the EPIC chart.  Gynecologic History No LMP recorded. Patient has had a hysterectomy. Contraception: status post hysterectomy Last Pap: 2012. Results were: normal Last mamogram 2014. Results were: Normal   Obstetric History OB History  Gravida Para Term Preterm AB SAB TAB Ectopic Multiple Living  0                  ROS: A ROS was performed and pertinent positives and negatives are included in the history.  GENERAL: No fevers or chills. HEENT: No change in vision, no earache, sore throat or sinus congestion. NECK: No pain or stiffness. CARDIOVASCULAR: No chest pain or  pressure. No palpitations. PULMONARY: No shortness of breath, cough or wheeze. GASTROINTESTINAL: No abdominal pain, nausea, vomiting or diarrhea, melena or bright red blood per rectum. GENITOURINARY: No urinary frequency, urgency, hesitancy or dysuria. MUSCULOSKELETAL: No joint or muscle pain, no back pain, no recent trauma. DERMATOLOGIC: No rash, no itching, no lesions. ENDOCRINE: No polyuria, polydipsia, no heat or cold intolerance. No recent change in weight. HEMATOLOGICAL: No anemia or easy bruising or bleeding. NEUROLOGIC: No headache, seizures, numbness, tingling or weakness. PSYCHIATRIC: No depression, no loss of interest in normal activity or change in sleep pattern.     Exam: chaperone present  BP 124/80  Ht 5' 1.25" (1.556 m)  Wt 136 lb (61.689 kg)  BMI 25.48 kg/m2  Body mass index is 25.48 kg/(m^2).  General appearance : Well developed well nourished female. No acute distress HEENT: Neck supple, trachea midline, no carotid bruits, no thyroidmegaly Lungs: Clear to auscultation, no rhonchi or wheezes, or rib retractions  Heart: Regular rate and rhythm, no murmurs or gallops Breast:Examined in sitting and supine position were symmetrical in appearance, no palpable masses or tenderness,  no skin retraction, no nipple inversion, no nipple discharge, no skin discoloration, no axillary or supraclavicular lymphadenopathy Abdomen: no palpable masses or tenderness, no rebound or guarding Extremities: no edema or skin discoloration or tenderness  Pelvic:  Bartholin, Urethra, Skene Glands: Within normal limits             Vagina: Yellowish discharge  Cervix: Absent  Uterus absent  Adnexa  Without  masses or tenderness  Anus and perineum  normal   Rectovaginal  normal sphincter tone without palpated masses or tenderness             Hemoccult PCP we'll provide   Wet prep: Daily, rare clue cells, few WBC, too numerous too numerous to count bacteria  Assessment/Plan:  63 y.o. female on  drug holiday with past history of osteoporosis and proved with several years of antireorptive agents. We'll continue with calcium and vitamin D and regular exercise. For her bacterial vaginosis and she will be prescribed Flagyl 500 mg one by mouth twice a day for 7 days. For her yeast infection she will be prescribed Diflucan 150 mg one by mouth today. Pap smear was done today. This will be her last Pap smear in accordance to the new guidelines. Patient will follow up with her PCP for her blood work and also with her endocrinologist whose been treating her for thyroid disease. She was reminded to schedule her mammogram which is overdue. Next year she will be due for a bone density study. Patient's Tdap and shingles vaccine are both up-to-date.  Note: This dictation was prepared with  Dragon/digital dictation along withSmart phrase technology. Any transcriptional errors that result from this process are unintentional.   Terrance Mass MD, 8:29 AM 10/16/2013

## 2013-10-17 LAB — CYTOLOGY - PAP

## 2014-03-03 ENCOUNTER — Encounter: Payer: Self-pay | Admitting: Gynecology

## 2015-06-15 DIAGNOSIS — S92302A Fracture of unspecified metatarsal bone(s), left foot, initial encounter for closed fracture: Secondary | ICD-10-CM | POA: Diagnosis not present

## 2015-06-29 DIAGNOSIS — M79672 Pain in left foot: Secondary | ICD-10-CM | POA: Diagnosis not present

## 2015-07-06 DIAGNOSIS — M84375D Stress fracture, left foot, subsequent encounter for fracture with routine healing: Secondary | ICD-10-CM | POA: Diagnosis not present

## 2015-07-06 DIAGNOSIS — M79622 Pain in left upper arm: Secondary | ICD-10-CM | POA: Diagnosis not present

## 2015-07-06 DIAGNOSIS — M25475 Effusion, left foot: Secondary | ICD-10-CM | POA: Diagnosis not present

## 2015-08-16 DIAGNOSIS — M79672 Pain in left foot: Secondary | ICD-10-CM | POA: Diagnosis not present

## 2015-08-23 DIAGNOSIS — M79672 Pain in left foot: Secondary | ICD-10-CM | POA: Diagnosis not present

## 2015-08-26 DIAGNOSIS — Z1211 Encounter for screening for malignant neoplasm of colon: Secondary | ICD-10-CM | POA: Diagnosis not present

## 2015-08-26 DIAGNOSIS — K5730 Diverticulosis of large intestine without perforation or abscess without bleeding: Secondary | ICD-10-CM | POA: Diagnosis not present

## 2015-08-26 DIAGNOSIS — Z800 Family history of malignant neoplasm of digestive organs: Secondary | ICD-10-CM | POA: Diagnosis not present

## 2015-08-27 DIAGNOSIS — E038 Other specified hypothyroidism: Secondary | ICD-10-CM | POA: Diagnosis not present

## 2015-08-27 DIAGNOSIS — I10 Essential (primary) hypertension: Secondary | ICD-10-CM | POA: Diagnosis not present

## 2015-08-27 DIAGNOSIS — E559 Vitamin D deficiency, unspecified: Secondary | ICD-10-CM | POA: Diagnosis not present

## 2015-08-31 DIAGNOSIS — M79672 Pain in left foot: Secondary | ICD-10-CM | POA: Diagnosis not present

## 2015-09-01 DIAGNOSIS — Z800 Family history of malignant neoplasm of digestive organs: Secondary | ICD-10-CM | POA: Diagnosis not present

## 2015-09-01 DIAGNOSIS — Z1211 Encounter for screening for malignant neoplasm of colon: Secondary | ICD-10-CM | POA: Diagnosis not present

## 2015-09-01 DIAGNOSIS — K5730 Diverticulosis of large intestine without perforation or abscess without bleeding: Secondary | ICD-10-CM | POA: Diagnosis not present

## 2015-09-03 DIAGNOSIS — Z85850 Personal history of malignant neoplasm of thyroid: Secondary | ICD-10-CM | POA: Diagnosis not present

## 2015-09-03 DIAGNOSIS — I10 Essential (primary) hypertension: Secondary | ICD-10-CM | POA: Diagnosis not present

## 2015-09-03 DIAGNOSIS — Z6825 Body mass index (BMI) 25.0-25.9, adult: Secondary | ICD-10-CM | POA: Diagnosis not present

## 2015-09-03 DIAGNOSIS — M810 Age-related osteoporosis without current pathological fracture: Secondary | ICD-10-CM | POA: Diagnosis not present

## 2015-09-03 DIAGNOSIS — Z1389 Encounter for screening for other disorder: Secondary | ICD-10-CM | POA: Diagnosis not present

## 2015-09-03 DIAGNOSIS — Z23 Encounter for immunization: Secondary | ICD-10-CM | POA: Diagnosis not present

## 2015-09-03 DIAGNOSIS — E559 Vitamin D deficiency, unspecified: Secondary | ICD-10-CM | POA: Diagnosis not present

## 2015-09-03 DIAGNOSIS — Z0000 Encounter for general adult medical examination without abnormal findings: Secondary | ICD-10-CM | POA: Diagnosis not present

## 2015-09-03 DIAGNOSIS — E038 Other specified hypothyroidism: Secondary | ICD-10-CM | POA: Diagnosis not present

## 2015-09-03 DIAGNOSIS — Z87312 Personal history of (healed) stress fracture: Secondary | ICD-10-CM | POA: Diagnosis not present

## 2015-09-08 DIAGNOSIS — M79672 Pain in left foot: Secondary | ICD-10-CM | POA: Diagnosis not present

## 2015-09-10 DIAGNOSIS — M84375D Stress fracture, left foot, subsequent encounter for fracture with routine healing: Secondary | ICD-10-CM | POA: Diagnosis not present

## 2015-09-10 DIAGNOSIS — M79672 Pain in left foot: Secondary | ICD-10-CM | POA: Diagnosis not present

## 2016-01-29 DIAGNOSIS — L237 Allergic contact dermatitis due to plants, except food: Secondary | ICD-10-CM | POA: Diagnosis not present

## 2016-03-06 DIAGNOSIS — E038 Other specified hypothyroidism: Secondary | ICD-10-CM | POA: Diagnosis not present

## 2016-03-06 DIAGNOSIS — I10 Essential (primary) hypertension: Secondary | ICD-10-CM | POA: Diagnosis not present

## 2016-06-06 DIAGNOSIS — D2262 Melanocytic nevi of left upper limb, including shoulder: Secondary | ICD-10-CM | POA: Diagnosis not present

## 2016-06-06 DIAGNOSIS — Z85828 Personal history of other malignant neoplasm of skin: Secondary | ICD-10-CM | POA: Diagnosis not present

## 2016-06-06 DIAGNOSIS — L821 Other seborrheic keratosis: Secondary | ICD-10-CM | POA: Diagnosis not present

## 2016-06-06 DIAGNOSIS — D2271 Melanocytic nevi of right lower limb, including hip: Secondary | ICD-10-CM | POA: Diagnosis not present

## 2016-06-06 DIAGNOSIS — L814 Other melanin hyperpigmentation: Secondary | ICD-10-CM | POA: Diagnosis not present

## 2016-06-06 DIAGNOSIS — D2272 Melanocytic nevi of left lower limb, including hip: Secondary | ICD-10-CM | POA: Diagnosis not present

## 2016-06-06 DIAGNOSIS — D2261 Melanocytic nevi of right upper limb, including shoulder: Secondary | ICD-10-CM | POA: Diagnosis not present

## 2016-06-06 DIAGNOSIS — D3612 Benign neoplasm of peripheral nerves and autonomic nervous system, upper limb, including shoulder: Secondary | ICD-10-CM | POA: Diagnosis not present

## 2016-06-06 DIAGNOSIS — D225 Melanocytic nevi of trunk: Secondary | ICD-10-CM | POA: Diagnosis not present

## 2016-06-06 DIAGNOSIS — D2372 Other benign neoplasm of skin of left lower limb, including hip: Secondary | ICD-10-CM | POA: Diagnosis not present

## 2016-08-28 DIAGNOSIS — M810 Age-related osteoporosis without current pathological fracture: Secondary | ICD-10-CM | POA: Diagnosis not present

## 2016-08-28 DIAGNOSIS — E559 Vitamin D deficiency, unspecified: Secondary | ICD-10-CM | POA: Diagnosis not present

## 2016-08-28 DIAGNOSIS — E038 Other specified hypothyroidism: Secondary | ICD-10-CM | POA: Diagnosis not present

## 2016-08-28 DIAGNOSIS — I10 Essential (primary) hypertension: Secondary | ICD-10-CM | POA: Diagnosis not present

## 2016-09-04 DIAGNOSIS — M25551 Pain in right hip: Secondary | ICD-10-CM | POA: Diagnosis not present

## 2016-09-04 DIAGNOSIS — Z0000 Encounter for general adult medical examination without abnormal findings: Secondary | ICD-10-CM | POA: Diagnosis not present

## 2016-09-04 DIAGNOSIS — Z1231 Encounter for screening mammogram for malignant neoplasm of breast: Secondary | ICD-10-CM | POA: Diagnosis not present

## 2016-09-04 DIAGNOSIS — I10 Essential (primary) hypertension: Secondary | ICD-10-CM | POA: Diagnosis not present

## 2016-09-04 DIAGNOSIS — E559 Vitamin D deficiency, unspecified: Secondary | ICD-10-CM | POA: Diagnosis not present

## 2016-09-20 ENCOUNTER — Encounter: Payer: Self-pay | Admitting: Gynecology

## 2016-09-22 DIAGNOSIS — M810 Age-related osteoporosis without current pathological fracture: Secondary | ICD-10-CM | POA: Diagnosis not present

## 2017-03-05 DIAGNOSIS — M810 Age-related osteoporosis without current pathological fracture: Secondary | ICD-10-CM | POA: Diagnosis not present

## 2017-03-05 DIAGNOSIS — I10 Essential (primary) hypertension: Secondary | ICD-10-CM | POA: Diagnosis not present

## 2017-03-05 DIAGNOSIS — E038 Other specified hypothyroidism: Secondary | ICD-10-CM | POA: Diagnosis not present

## 2017-03-05 DIAGNOSIS — Z85850 Personal history of malignant neoplasm of thyroid: Secondary | ICD-10-CM | POA: Diagnosis not present

## 2017-03-26 DIAGNOSIS — M810 Age-related osteoporosis without current pathological fracture: Secondary | ICD-10-CM | POA: Diagnosis not present

## 2017-06-06 DIAGNOSIS — L821 Other seborrheic keratosis: Secondary | ICD-10-CM | POA: Diagnosis not present

## 2017-06-06 DIAGNOSIS — L430 Hypertrophic lichen planus: Secondary | ICD-10-CM | POA: Diagnosis not present

## 2017-06-06 DIAGNOSIS — L814 Other melanin hyperpigmentation: Secondary | ICD-10-CM | POA: Diagnosis not present

## 2017-06-06 DIAGNOSIS — Z85828 Personal history of other malignant neoplasm of skin: Secondary | ICD-10-CM | POA: Diagnosis not present

## 2017-06-06 DIAGNOSIS — D225 Melanocytic nevi of trunk: Secondary | ICD-10-CM | POA: Diagnosis not present

## 2017-07-17 DIAGNOSIS — J069 Acute upper respiratory infection, unspecified: Secondary | ICD-10-CM | POA: Diagnosis not present

## 2017-09-07 DIAGNOSIS — I10 Essential (primary) hypertension: Secondary | ICD-10-CM | POA: Diagnosis not present

## 2017-09-07 DIAGNOSIS — R82998 Other abnormal findings in urine: Secondary | ICD-10-CM | POA: Diagnosis not present

## 2017-09-07 DIAGNOSIS — E038 Other specified hypothyroidism: Secondary | ICD-10-CM | POA: Diagnosis not present

## 2017-09-07 DIAGNOSIS — E559 Vitamin D deficiency, unspecified: Secondary | ICD-10-CM | POA: Diagnosis not present

## 2017-09-14 DIAGNOSIS — E559 Vitamin D deficiency, unspecified: Secondary | ICD-10-CM | POA: Diagnosis not present

## 2017-09-14 DIAGNOSIS — E038 Other specified hypothyroidism: Secondary | ICD-10-CM | POA: Diagnosis not present

## 2017-09-14 DIAGNOSIS — M810 Age-related osteoporosis without current pathological fracture: Secondary | ICD-10-CM | POA: Diagnosis not present

## 2017-09-14 DIAGNOSIS — Z1389 Encounter for screening for other disorder: Secondary | ICD-10-CM | POA: Diagnosis not present

## 2017-09-14 DIAGNOSIS — I10 Essential (primary) hypertension: Secondary | ICD-10-CM | POA: Diagnosis not present

## 2017-09-14 DIAGNOSIS — Z0000 Encounter for general adult medical examination without abnormal findings: Secondary | ICD-10-CM | POA: Diagnosis not present

## 2017-09-14 DIAGNOSIS — Z85850 Personal history of malignant neoplasm of thyroid: Secondary | ICD-10-CM | POA: Diagnosis not present

## 2017-09-14 DIAGNOSIS — Z87312 Personal history of (healed) stress fracture: Secondary | ICD-10-CM | POA: Diagnosis not present

## 2017-09-14 DIAGNOSIS — Z6825 Body mass index (BMI) 25.0-25.9, adult: Secondary | ICD-10-CM | POA: Diagnosis not present

## 2017-09-19 DIAGNOSIS — Z1212 Encounter for screening for malignant neoplasm of rectum: Secondary | ICD-10-CM | POA: Diagnosis not present

## 2017-09-24 DIAGNOSIS — M810 Age-related osteoporosis without current pathological fracture: Secondary | ICD-10-CM | POA: Diagnosis not present

## 2017-11-03 DIAGNOSIS — L237 Allergic contact dermatitis due to plants, except food: Secondary | ICD-10-CM | POA: Diagnosis not present

## 2017-12-25 DIAGNOSIS — E038 Other specified hypothyroidism: Secondary | ICD-10-CM | POA: Diagnosis not present

## 2018-01-04 DIAGNOSIS — Z1231 Encounter for screening mammogram for malignant neoplasm of breast: Secondary | ICD-10-CM | POA: Diagnosis not present

## 2018-01-19 DIAGNOSIS — Z23 Encounter for immunization: Secondary | ICD-10-CM | POA: Diagnosis not present

## 2018-04-01 DIAGNOSIS — M810 Age-related osteoporosis without current pathological fracture: Secondary | ICD-10-CM | POA: Diagnosis not present

## 2018-06-06 DIAGNOSIS — L820 Inflamed seborrheic keratosis: Secondary | ICD-10-CM | POA: Diagnosis not present

## 2018-06-06 DIAGNOSIS — B0089 Other herpesviral infection: Secondary | ICD-10-CM | POA: Diagnosis not present

## 2018-06-06 DIAGNOSIS — D2262 Melanocytic nevi of left upper limb, including shoulder: Secondary | ICD-10-CM | POA: Diagnosis not present

## 2018-06-06 DIAGNOSIS — Z85828 Personal history of other malignant neoplasm of skin: Secondary | ICD-10-CM | POA: Diagnosis not present

## 2018-06-06 DIAGNOSIS — D2261 Melanocytic nevi of right upper limb, including shoulder: Secondary | ICD-10-CM | POA: Diagnosis not present

## 2018-07-05 DIAGNOSIS — R52 Pain, unspecified: Secondary | ICD-10-CM | POA: Diagnosis not present

## 2018-07-05 DIAGNOSIS — J209 Acute bronchitis, unspecified: Secondary | ICD-10-CM | POA: Diagnosis not present

## 2018-09-18 DIAGNOSIS — R82998 Other abnormal findings in urine: Secondary | ICD-10-CM | POA: Diagnosis not present

## 2018-09-18 DIAGNOSIS — M810 Age-related osteoporosis without current pathological fracture: Secondary | ICD-10-CM | POA: Diagnosis not present

## 2018-09-18 DIAGNOSIS — E038 Other specified hypothyroidism: Secondary | ICD-10-CM | POA: Diagnosis not present

## 2018-09-18 DIAGNOSIS — I10 Essential (primary) hypertension: Secondary | ICD-10-CM | POA: Diagnosis not present

## 2018-09-25 DIAGNOSIS — Z0000 Encounter for general adult medical examination without abnormal findings: Secondary | ICD-10-CM | POA: Diagnosis not present

## 2018-09-25 DIAGNOSIS — Z85850 Personal history of malignant neoplasm of thyroid: Secondary | ICD-10-CM | POA: Diagnosis not present

## 2018-09-25 DIAGNOSIS — I10 Essential (primary) hypertension: Secondary | ICD-10-CM | POA: Diagnosis not present

## 2018-09-25 DIAGNOSIS — E039 Hypothyroidism, unspecified: Secondary | ICD-10-CM | POA: Diagnosis not present

## 2018-10-01 DIAGNOSIS — M810 Age-related osteoporosis without current pathological fracture: Secondary | ICD-10-CM | POA: Diagnosis not present

## 2019-01-23 DIAGNOSIS — Z23 Encounter for immunization: Secondary | ICD-10-CM | POA: Diagnosis not present

## 2019-04-01 DIAGNOSIS — M810 Age-related osteoporosis without current pathological fracture: Secondary | ICD-10-CM | POA: Diagnosis not present

## 2019-04-07 DIAGNOSIS — M810 Age-related osteoporosis without current pathological fracture: Secondary | ICD-10-CM | POA: Diagnosis not present

## 2019-05-27 ENCOUNTER — Ambulatory Visit: Payer: Medicare Other | Attending: Internal Medicine

## 2019-05-27 DIAGNOSIS — Z23 Encounter for immunization: Secondary | ICD-10-CM

## 2019-05-27 NOTE — Progress Notes (Signed)
   Covid-19 Vaccination Clinic  Name:  [name]    MRN: [id number] DOB: 15-Jan-1951  05/27/2019  [name] was observed post Covid-19 immunization for 15 minutes without incidence. She was provided with Vaccine Information Sheet and instruction to access the V-Safe system.   [name] was instructed to call 911 with any severe reactions post vaccine: Marland Kitchen Difficulty breathing  . Swelling of your face and throat  . A fast heartbeat  . A bad rash all over your body  . Dizziness and weakness    Immunizations Administered    Name Date Dose VIS Date Route   Pfizer COVID-19 Vaccine 05/27/2019  5:33 PM 0.3 mL 04/18/2019 Intramuscular   Manufacturer: [place]   Lot: [id number]   [place]: [id number]

## 2019-06-02 DIAGNOSIS — Z1231 Encounter for screening mammogram for malignant neoplasm of breast: Secondary | ICD-10-CM | POA: Diagnosis not present

## 2019-06-09 DIAGNOSIS — Z85828 Personal history of other malignant neoplasm of skin: Secondary | ICD-10-CM | POA: Diagnosis not present

## 2019-06-09 DIAGNOSIS — L821 Other seborrheic keratosis: Secondary | ICD-10-CM | POA: Diagnosis not present

## 2019-06-09 DIAGNOSIS — B0089 Other herpesviral infection: Secondary | ICD-10-CM | POA: Diagnosis not present

## 2019-06-09 DIAGNOSIS — D225 Melanocytic nevi of trunk: Secondary | ICD-10-CM | POA: Diagnosis not present

## 2019-06-16 ENCOUNTER — Ambulatory Visit: Payer: Medicare Other | Attending: Internal Medicine

## 2019-06-16 DIAGNOSIS — Z23 Encounter for immunization: Secondary | ICD-10-CM

## 2019-06-16 NOTE — Progress Notes (Signed)
   Covid-19 Vaccination Clinic  Name:  [name]    MRN: [id number] DOB: [birth year]  06/16/2019  [name] was observed post Covid-19 immunization for 15 minutes without incidence. She was provided with Vaccine Information Sheet and instruction to access the V-Safe system.   [name] was instructed to call 911 with any severe reactions post vaccine: Marland Kitchen Difficulty breathing  . Swelling of your face and throat  . A fast heartbeat  . A bad rash all over your body  . Dizziness and weakness    Immunizations Administered    Name Date Dose VIS Date Route   Pfizer COVID-19 Vaccine 06/16/2019  9:25 AM 0.3 mL 04/18/2019 Intramuscular   Manufacturer: [place]   Lot: [id number]   [place]: [id number]

## 2019-06-19 ENCOUNTER — Ambulatory Visit: Payer: Self-pay

## 2019-09-19 DIAGNOSIS — E038 Other specified hypothyroidism: Secondary | ICD-10-CM | POA: Diagnosis not present

## 2019-09-19 DIAGNOSIS — I10 Essential (primary) hypertension: Secondary | ICD-10-CM | POA: Diagnosis not present

## 2019-09-19 DIAGNOSIS — Z0000 Encounter for general adult medical examination without abnormal findings: Secondary | ICD-10-CM | POA: Diagnosis not present

## 2019-09-19 DIAGNOSIS — M810 Age-related osteoporosis without current pathological fracture: Secondary | ICD-10-CM | POA: Diagnosis not present

## 2019-09-29 DIAGNOSIS — E785 Hyperlipidemia, unspecified: Secondary | ICD-10-CM | POA: Diagnosis not present

## 2019-09-29 DIAGNOSIS — Z0000 Encounter for general adult medical examination without abnormal findings: Secondary | ICD-10-CM | POA: Diagnosis not present

## 2019-09-29 DIAGNOSIS — I10 Essential (primary) hypertension: Secondary | ICD-10-CM | POA: Diagnosis not present

## 2019-09-29 DIAGNOSIS — R82998 Other abnormal findings in urine: Secondary | ICD-10-CM | POA: Diagnosis not present

## 2019-09-29 DIAGNOSIS — R7301 Impaired fasting glucose: Secondary | ICD-10-CM | POA: Diagnosis not present

## 2019-10-01 DIAGNOSIS — Z1212 Encounter for screening for malignant neoplasm of rectum: Secondary | ICD-10-CM | POA: Diagnosis not present

## 2019-10-07 DIAGNOSIS — M810 Age-related osteoporosis without current pathological fracture: Secondary | ICD-10-CM | POA: Diagnosis not present

## 2020-04-08 DIAGNOSIS — M810 Age-related osteoporosis without current pathological fracture: Secondary | ICD-10-CM | POA: Diagnosis not present

## 2020-05-17 DIAGNOSIS — H903 Sensorineural hearing loss, bilateral: Secondary | ICD-10-CM | POA: Diagnosis not present

## 2020-06-10 DIAGNOSIS — L570 Actinic keratosis: Secondary | ICD-10-CM | POA: Diagnosis not present

## 2020-06-10 DIAGNOSIS — Z85828 Personal history of other malignant neoplasm of skin: Secondary | ICD-10-CM | POA: Diagnosis not present

## 2020-06-10 DIAGNOSIS — D2262 Melanocytic nevi of left upper limb, including shoulder: Secondary | ICD-10-CM | POA: Diagnosis not present

## 2020-06-10 DIAGNOSIS — D2261 Melanocytic nevi of right upper limb, including shoulder: Secondary | ICD-10-CM | POA: Diagnosis not present

## 2020-06-16 DIAGNOSIS — Z1231 Encounter for screening mammogram for malignant neoplasm of breast: Secondary | ICD-10-CM | POA: Diagnosis not present

## 2020-10-18 DIAGNOSIS — E559 Vitamin D deficiency, unspecified: Secondary | ICD-10-CM | POA: Diagnosis not present

## 2020-10-18 DIAGNOSIS — E785 Hyperlipidemia, unspecified: Secondary | ICD-10-CM | POA: Diagnosis not present

## 2020-10-18 DIAGNOSIS — E039 Hypothyroidism, unspecified: Secondary | ICD-10-CM | POA: Diagnosis not present

## 2020-10-18 DIAGNOSIS — Z0000 Encounter for general adult medical examination without abnormal findings: Secondary | ICD-10-CM | POA: Diagnosis not present

## 2020-10-22 DIAGNOSIS — M810 Age-related osteoporosis without current pathological fracture: Secondary | ICD-10-CM | POA: Diagnosis not present

## 2020-10-25 DIAGNOSIS — R82998 Other abnormal findings in urine: Secondary | ICD-10-CM | POA: Diagnosis not present

## 2020-10-25 DIAGNOSIS — I10 Essential (primary) hypertension: Secondary | ICD-10-CM | POA: Diagnosis not present

## 2020-10-25 DIAGNOSIS — Z1212 Encounter for screening for malignant neoplasm of rectum: Secondary | ICD-10-CM | POA: Diagnosis not present

## 2020-11-29 DIAGNOSIS — M810 Age-related osteoporosis without current pathological fracture: Secondary | ICD-10-CM | POA: Diagnosis not present

## 2020-12-14 DIAGNOSIS — Z1211 Encounter for screening for malignant neoplasm of colon: Secondary | ICD-10-CM | POA: Diagnosis not present

## 2020-12-14 DIAGNOSIS — Z800 Family history of malignant neoplasm of digestive organs: Secondary | ICD-10-CM | POA: Diagnosis not present

## 2020-12-14 DIAGNOSIS — K5730 Diverticulosis of large intestine without perforation or abscess without bleeding: Secondary | ICD-10-CM | POA: Diagnosis not present

## 2021-01-26 DIAGNOSIS — Z800 Family history of malignant neoplasm of digestive organs: Secondary | ICD-10-CM | POA: Diagnosis not present

## 2021-01-26 DIAGNOSIS — Z1211 Encounter for screening for malignant neoplasm of colon: Secondary | ICD-10-CM | POA: Diagnosis not present

## 2021-01-26 DIAGNOSIS — K5730 Diverticulosis of large intestine without perforation or abscess without bleeding: Secondary | ICD-10-CM | POA: Diagnosis not present

## 2021-01-28 ENCOUNTER — Other Ambulatory Visit: Payer: Self-pay | Admitting: Internal Medicine

## 2021-01-28 DIAGNOSIS — M5440 Lumbago with sciatica, unspecified side: Secondary | ICD-10-CM

## 2021-02-12 DIAGNOSIS — Z23 Encounter for immunization: Secondary | ICD-10-CM | POA: Diagnosis not present

## 2021-02-18 ENCOUNTER — Ambulatory Visit
Admission: RE | Admit: 2021-02-18 | Discharge: 2021-02-18 | Disposition: A | Discharge status: Home / Self Care | Payer: Medicare Other | Source: Ambulatory Visit | Attending: Internal Medicine | Admitting: Internal Medicine

## 2021-02-18 DIAGNOSIS — M5440 Lumbago with sciatica, unspecified side: Secondary | ICD-10-CM

## 2021-02-18 DIAGNOSIS — M545 Low back pain, unspecified: Secondary | ICD-10-CM | POA: Diagnosis not present

## 2021-02-18 DIAGNOSIS — M48061 Spinal stenosis, lumbar region without neurogenic claudication: Secondary | ICD-10-CM | POA: Diagnosis not present

## 2021-02-18 DIAGNOSIS — M4316 Spondylolisthesis, lumbar region: Secondary | ICD-10-CM | POA: Diagnosis not present

## 2021-03-21 DIAGNOSIS — M5416 Radiculopathy, lumbar region: Secondary | ICD-10-CM | POA: Diagnosis not present

## 2021-04-26 DIAGNOSIS — M810 Age-related osteoporosis without current pathological fracture: Secondary | ICD-10-CM | POA: Diagnosis not present

## 2021-06-16 DIAGNOSIS — L814 Other melanin hyperpigmentation: Secondary | ICD-10-CM | POA: Diagnosis not present

## 2021-06-16 DIAGNOSIS — D0371 Melanoma in situ of right lower limb, including hip: Secondary | ICD-10-CM | POA: Diagnosis not present

## 2021-06-16 DIAGNOSIS — D224 Melanocytic nevi of scalp and neck: Secondary | ICD-10-CM | POA: Diagnosis not present

## 2021-06-16 DIAGNOSIS — Z85828 Personal history of other malignant neoplasm of skin: Secondary | ICD-10-CM | POA: Diagnosis not present

## 2021-06-16 DIAGNOSIS — D225 Melanocytic nevi of trunk: Secondary | ICD-10-CM | POA: Diagnosis not present

## 2021-06-17 DIAGNOSIS — Z1231 Encounter for screening mammogram for malignant neoplasm of breast: Secondary | ICD-10-CM | POA: Diagnosis not present

## 2021-07-14 DIAGNOSIS — L988 Other specified disorders of the skin and subcutaneous tissue: Secondary | ICD-10-CM | POA: Diagnosis not present

## 2021-07-14 DIAGNOSIS — D0371 Melanoma in situ of right lower limb, including hip: Secondary | ICD-10-CM | POA: Diagnosis not present

## 2021-07-14 DIAGNOSIS — Z85828 Personal history of other malignant neoplasm of skin: Secondary | ICD-10-CM | POA: Diagnosis not present

## 2021-09-07 DIAGNOSIS — H1032 Unspecified acute conjunctivitis, left eye: Secondary | ICD-10-CM | POA: Diagnosis not present

## 2021-11-14 DIAGNOSIS — E559 Vitamin D deficiency, unspecified: Secondary | ICD-10-CM | POA: Diagnosis not present

## 2021-11-14 DIAGNOSIS — E785 Hyperlipidemia, unspecified: Secondary | ICD-10-CM | POA: Diagnosis not present

## 2021-11-14 DIAGNOSIS — E039 Hypothyroidism, unspecified: Secondary | ICD-10-CM | POA: Diagnosis not present

## 2021-11-14 DIAGNOSIS — I10 Essential (primary) hypertension: Secondary | ICD-10-CM | POA: Diagnosis not present

## 2021-11-21 DIAGNOSIS — R82998 Other abnormal findings in urine: Secondary | ICD-10-CM | POA: Diagnosis not present

## 2021-11-21 DIAGNOSIS — R7301 Impaired fasting glucose: Secondary | ICD-10-CM | POA: Diagnosis not present

## 2021-11-21 DIAGNOSIS — E785 Hyperlipidemia, unspecified: Secondary | ICD-10-CM | POA: Diagnosis not present

## 2021-11-21 DIAGNOSIS — Z0000 Encounter for general adult medical examination without abnormal findings: Secondary | ICD-10-CM | POA: Diagnosis not present

## 2021-11-21 DIAGNOSIS — I10 Essential (primary) hypertension: Secondary | ICD-10-CM | POA: Diagnosis not present

## 2021-11-28 DIAGNOSIS — M810 Age-related osteoporosis without current pathological fracture: Secondary | ICD-10-CM | POA: Diagnosis not present

## 2022-02-11 DIAGNOSIS — Z23 Encounter for immunization: Secondary | ICD-10-CM | POA: Diagnosis not present

## 2022-05-15 DIAGNOSIS — K080 Exfoliation of teeth due to systemic causes: Secondary | ICD-10-CM | POA: Diagnosis not present

## 2022-05-19 DIAGNOSIS — M5416 Radiculopathy, lumbar region: Secondary | ICD-10-CM | POA: Diagnosis not present

## 2022-05-23 ENCOUNTER — Other Ambulatory Visit: Payer: Self-pay | Admitting: Neurosurgery

## 2022-05-23 DIAGNOSIS — M5416 Radiculopathy, lumbar region: Secondary | ICD-10-CM

## 2022-05-27 ENCOUNTER — Ambulatory Visit
Admission: RE | Admit: 2022-05-27 | Discharge: 2022-05-27 | Disposition: A | Discharge status: Home / Self Care | Payer: Medicare Other | Source: Ambulatory Visit | Attending: Neurosurgery | Admitting: Neurosurgery

## 2022-05-27 DIAGNOSIS — M48061 Spinal stenosis, lumbar region without neurogenic claudication: Secondary | ICD-10-CM | POA: Diagnosis not present

## 2022-05-27 DIAGNOSIS — M4316 Spondylolisthesis, lumbar region: Secondary | ICD-10-CM | POA: Diagnosis not present

## 2022-05-27 DIAGNOSIS — M545 Low back pain, unspecified: Secondary | ICD-10-CM | POA: Diagnosis not present

## 2022-05-27 DIAGNOSIS — M5416 Radiculopathy, lumbar region: Secondary | ICD-10-CM

## 2022-06-01 DIAGNOSIS — M810 Age-related osteoporosis without current pathological fracture: Secondary | ICD-10-CM | POA: Diagnosis not present

## 2022-06-05 DIAGNOSIS — M5416 Radiculopathy, lumbar region: Secondary | ICD-10-CM | POA: Diagnosis not present

## 2022-06-19 DIAGNOSIS — L814 Other melanin hyperpigmentation: Secondary | ICD-10-CM | POA: Diagnosis not present

## 2022-06-19 DIAGNOSIS — L853 Xerosis cutis: Secondary | ICD-10-CM | POA: Diagnosis not present

## 2022-06-19 DIAGNOSIS — D1801 Hemangioma of skin and subcutaneous tissue: Secondary | ICD-10-CM | POA: Diagnosis not present

## 2022-06-19 DIAGNOSIS — Z85828 Personal history of other malignant neoplasm of skin: Secondary | ICD-10-CM | POA: Diagnosis not present

## 2022-06-19 DIAGNOSIS — L570 Actinic keratosis: Secondary | ICD-10-CM | POA: Diagnosis not present

## 2022-06-30 DIAGNOSIS — Z1231 Encounter for screening mammogram for malignant neoplasm of breast: Secondary | ICD-10-CM | POA: Diagnosis not present

## 2022-07-20 DIAGNOSIS — G9741 Accidental puncture or laceration of dura during a procedure: Secondary | ICD-10-CM | POA: Diagnosis not present

## 2022-07-20 DIAGNOSIS — M48061 Spinal stenosis, lumbar region without neurogenic claudication: Secondary | ICD-10-CM | POA: Diagnosis not present

## 2022-07-20 DIAGNOSIS — M5117 Intervertebral disc disorders with radiculopathy, lumbosacral region: Secondary | ICD-10-CM | POA: Diagnosis not present

## 2022-07-20 DIAGNOSIS — M4807 Spinal stenosis, lumbosacral region: Secondary | ICD-10-CM | POA: Diagnosis not present

## 2022-08-16 DIAGNOSIS — M545 Low back pain, unspecified: Secondary | ICD-10-CM | POA: Diagnosis not present

## 2022-08-22 DIAGNOSIS — M545 Low back pain, unspecified: Secondary | ICD-10-CM | POA: Diagnosis not present

## 2022-08-25 DIAGNOSIS — M545 Low back pain, unspecified: Secondary | ICD-10-CM | POA: Diagnosis not present

## 2022-08-30 DIAGNOSIS — M545 Low back pain, unspecified: Secondary | ICD-10-CM | POA: Diagnosis not present

## 2022-09-01 DIAGNOSIS — M545 Low back pain, unspecified: Secondary | ICD-10-CM | POA: Diagnosis not present

## 2022-09-06 DIAGNOSIS — M545 Low back pain, unspecified: Secondary | ICD-10-CM | POA: Diagnosis not present

## 2022-09-08 DIAGNOSIS — M545 Low back pain, unspecified: Secondary | ICD-10-CM | POA: Diagnosis not present

## 2022-09-11 DIAGNOSIS — M545 Low back pain, unspecified: Secondary | ICD-10-CM | POA: Diagnosis not present

## 2022-09-13 DIAGNOSIS — M545 Low back pain, unspecified: Secondary | ICD-10-CM | POA: Diagnosis not present

## 2022-09-18 DIAGNOSIS — M545 Low back pain, unspecified: Secondary | ICD-10-CM | POA: Diagnosis not present

## 2022-09-20 DIAGNOSIS — M545 Low back pain, unspecified: Secondary | ICD-10-CM | POA: Diagnosis not present

## 2022-09-21 DIAGNOSIS — M5416 Radiculopathy, lumbar region: Secondary | ICD-10-CM | POA: Diagnosis not present

## 2022-09-25 DIAGNOSIS — M545 Low back pain, unspecified: Secondary | ICD-10-CM | POA: Diagnosis not present

## 2022-09-27 DIAGNOSIS — M545 Low back pain, unspecified: Secondary | ICD-10-CM | POA: Diagnosis not present

## 2022-10-23 DIAGNOSIS — M5416 Radiculopathy, lumbar region: Secondary | ICD-10-CM | POA: Diagnosis not present

## 2022-10-23 DIAGNOSIS — Z6825 Body mass index (BMI) 25.0-25.9, adult: Secondary | ICD-10-CM | POA: Diagnosis not present

## 2022-11-20 DIAGNOSIS — K080 Exfoliation of teeth due to systemic causes: Secondary | ICD-10-CM | POA: Diagnosis not present

## 2022-11-28 DIAGNOSIS — M810 Age-related osteoporosis without current pathological fracture: Secondary | ICD-10-CM | POA: Diagnosis not present

## 2022-12-01 DIAGNOSIS — M810 Age-related osteoporosis without current pathological fracture: Secondary | ICD-10-CM | POA: Diagnosis not present

## 2022-12-14 DIAGNOSIS — I10 Essential (primary) hypertension: Secondary | ICD-10-CM | POA: Diagnosis not present

## 2022-12-14 DIAGNOSIS — E039 Hypothyroidism, unspecified: Secondary | ICD-10-CM | POA: Diagnosis not present

## 2022-12-14 DIAGNOSIS — E559 Vitamin D deficiency, unspecified: Secondary | ICD-10-CM | POA: Diagnosis not present

## 2022-12-14 DIAGNOSIS — R7301 Impaired fasting glucose: Secondary | ICD-10-CM | POA: Diagnosis not present

## 2022-12-14 DIAGNOSIS — E785 Hyperlipidemia, unspecified: Secondary | ICD-10-CM | POA: Diagnosis not present

## 2022-12-14 DIAGNOSIS — M810 Age-related osteoporosis without current pathological fracture: Secondary | ICD-10-CM | POA: Diagnosis not present

## 2022-12-20 DIAGNOSIS — Z23 Encounter for immunization: Secondary | ICD-10-CM | POA: Diagnosis not present

## 2022-12-20 DIAGNOSIS — Z1339 Encounter for screening examination for other mental health and behavioral disorders: Secondary | ICD-10-CM | POA: Diagnosis not present

## 2022-12-20 DIAGNOSIS — Z0000 Encounter for general adult medical examination without abnormal findings: Secondary | ICD-10-CM | POA: Diagnosis not present

## 2022-12-20 DIAGNOSIS — Z1331 Encounter for screening for depression: Secondary | ICD-10-CM | POA: Diagnosis not present

## 2022-12-20 DIAGNOSIS — R82998 Other abnormal findings in urine: Secondary | ICD-10-CM | POA: Diagnosis not present

## 2022-12-20 DIAGNOSIS — I10 Essential (primary) hypertension: Secondary | ICD-10-CM | POA: Diagnosis not present

## 2022-12-25 DIAGNOSIS — M5416 Radiculopathy, lumbar region: Secondary | ICD-10-CM | POA: Diagnosis not present

## 2023-01-22 DIAGNOSIS — M5416 Radiculopathy, lumbar region: Secondary | ICD-10-CM | POA: Diagnosis not present

## 2023-02-02 DIAGNOSIS — H2513 Age-related nuclear cataract, bilateral: Secondary | ICD-10-CM | POA: Diagnosis not present

## 2023-02-02 DIAGNOSIS — H1131 Conjunctival hemorrhage, right eye: Secondary | ICD-10-CM | POA: Diagnosis not present

## 2023-02-02 DIAGNOSIS — S0591XA Unspecified injury of right eye and orbit, initial encounter: Secondary | ICD-10-CM | POA: Diagnosis not present

## 2023-02-02 DIAGNOSIS — H25043 Posterior subcapsular polar age-related cataract, bilateral: Secondary | ICD-10-CM | POA: Diagnosis not present

## 2023-02-02 DIAGNOSIS — H18413 Arcus senilis, bilateral: Secondary | ICD-10-CM | POA: Diagnosis not present

## 2023-02-10 DIAGNOSIS — Z23 Encounter for immunization: Secondary | ICD-10-CM | POA: Diagnosis not present

## 2023-03-05 IMAGING — MR MR LUMBAR SPINE W/O CM
4 of 5 series · 27 of 48 positions shown · non-contrast
Comparison: None.

CLINICAL DATA: Low back pain with sciatica, sciatica laterality
unspecified, unspecified back pain laterality, unspecified
chronicity 0A0.0N (AD1-G7-CM)

EXAM:
MRI LUMBAR SPINE WITHOUT CONTRAST
TECHNIQUE: Multiplanar, multisequence MR imaging of the lumbar spine was
performed. No intravenous contrast was administered.

[Series 3: T2 · sagittal · 4.0mm · 1.09mm/px · 6 of 15 slices shown (1 of 2)]
[im 1/15]
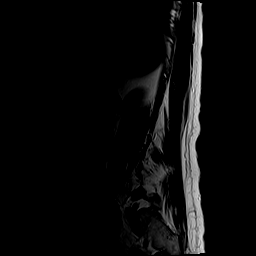
[im 3/15]
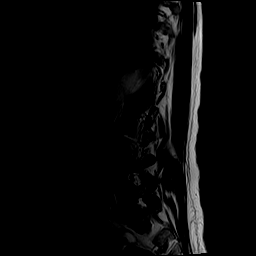
[im 6/15]
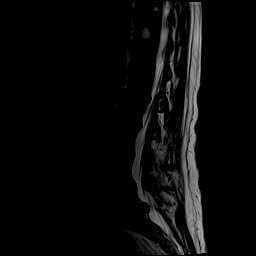
[im 9/15]
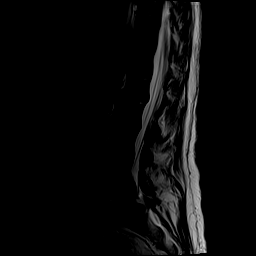
[im 12/15]
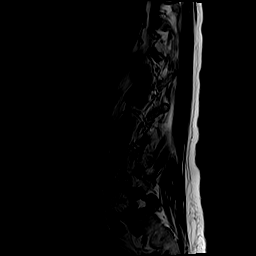
[im 15/15]
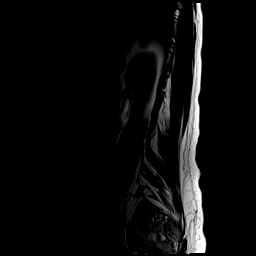

[Series 5: T1 · sagittal · 4.0mm · 1.09mm/px · 6 of 15 slices shown (1 of 2)]
[im 1/15]
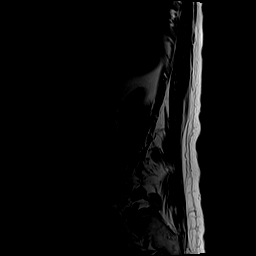
[im 3/15]
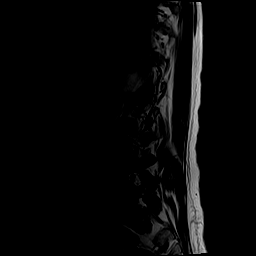
[im 6/15]
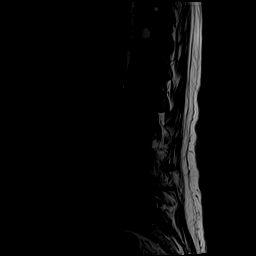
[im 9/15]
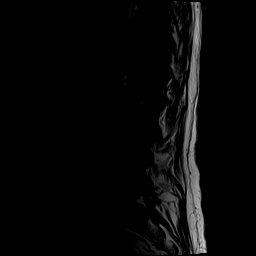
[im 12/15]
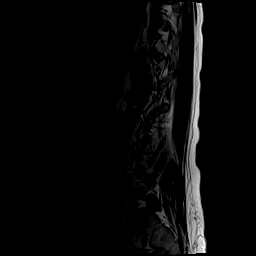
[im 15/15]
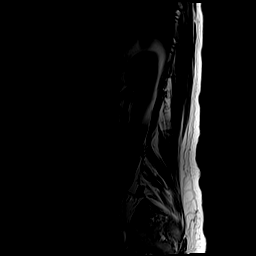

[Series 6: T2 · axial · 4.0mm · 0.39mm/px · z∈[-46,+155]mm · 9 of 37 slices shown (2 of 2)]
[im 1/37]
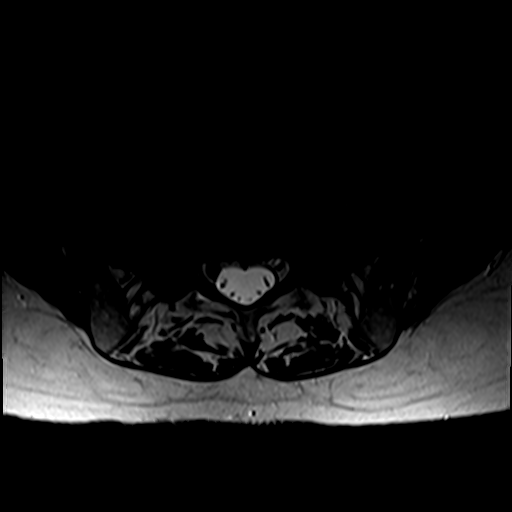
[im 6/37]
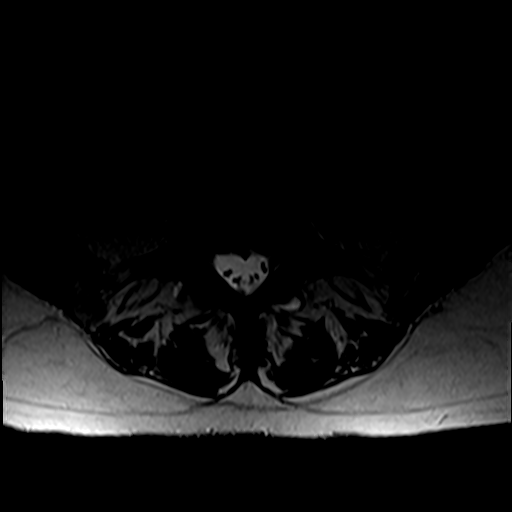
[im 11/37]
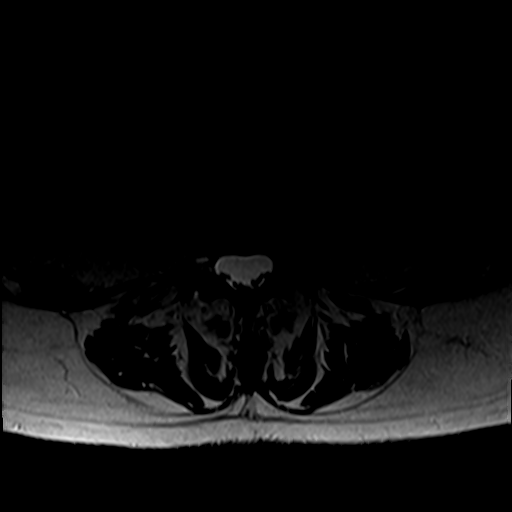
[im 16/37]
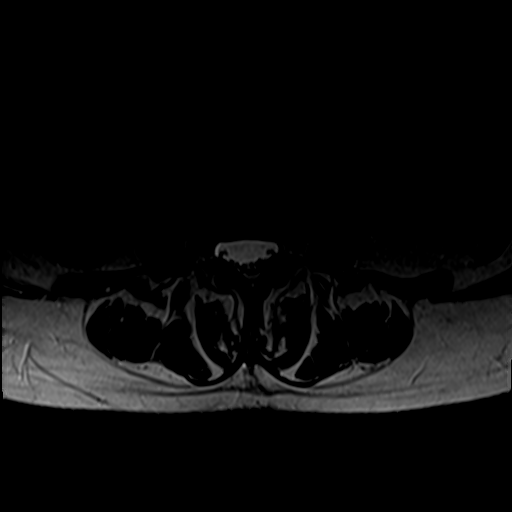
[im 19/37]
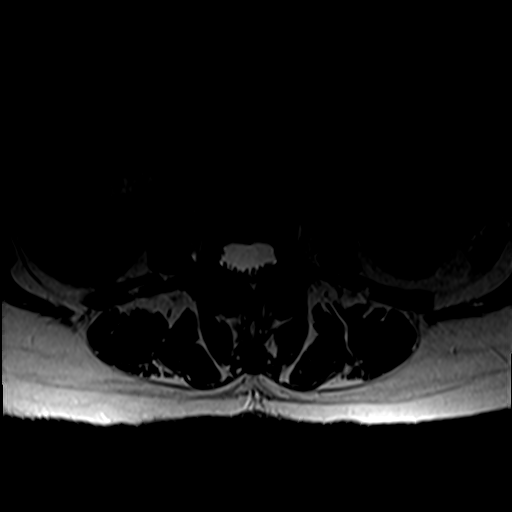
[im 21/37]
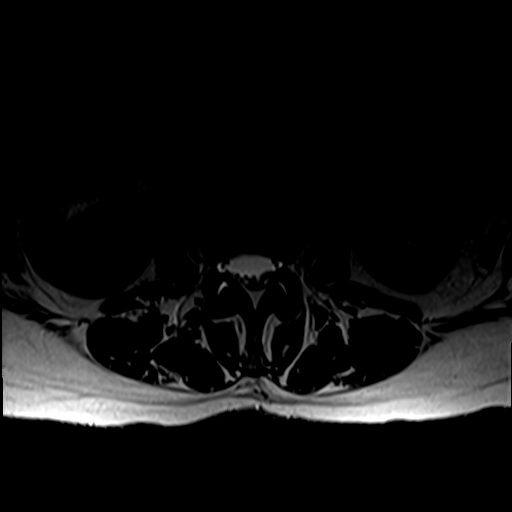
[im 26/37]
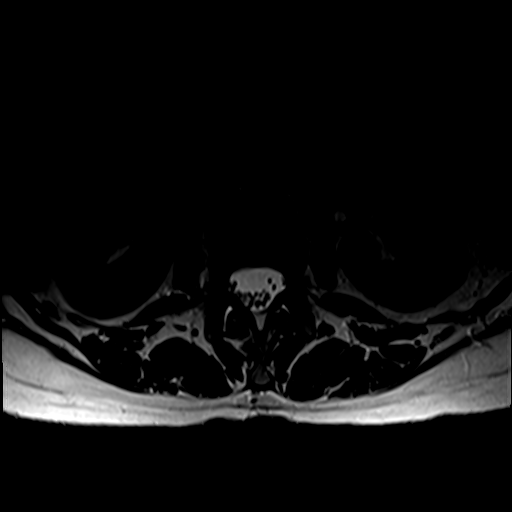
[im 31/37]
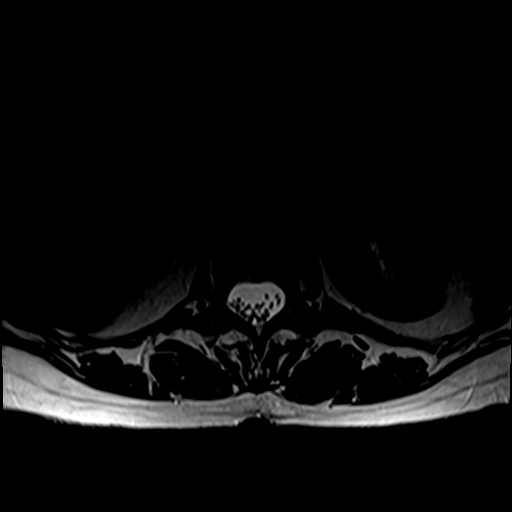
[im 37/37]
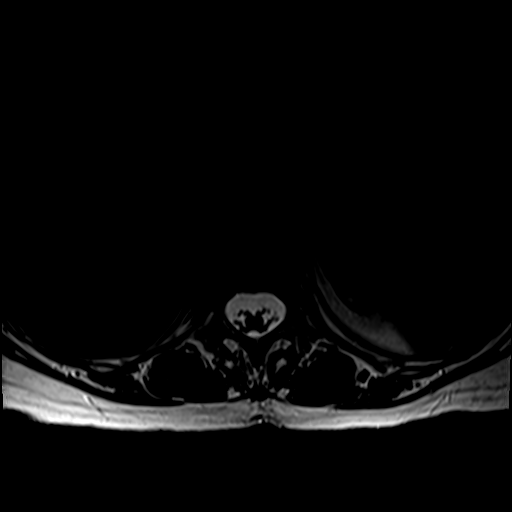

[Series 7: T1 · axial · 4.0mm · 0.39mm/px · z∈[-46,+126]mm · 6 of 37 slices shown (2 of 2)]
[im 1/37]
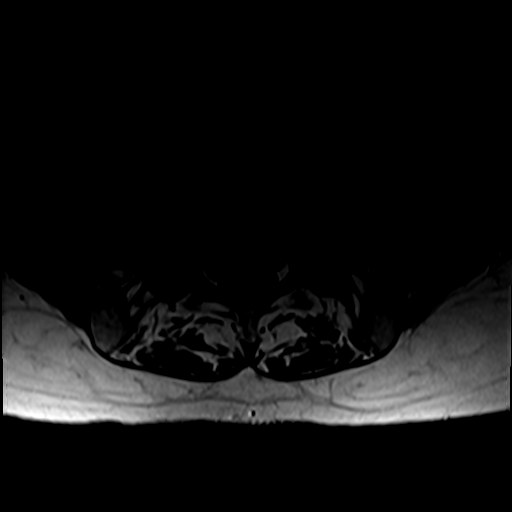
[im 6/37]
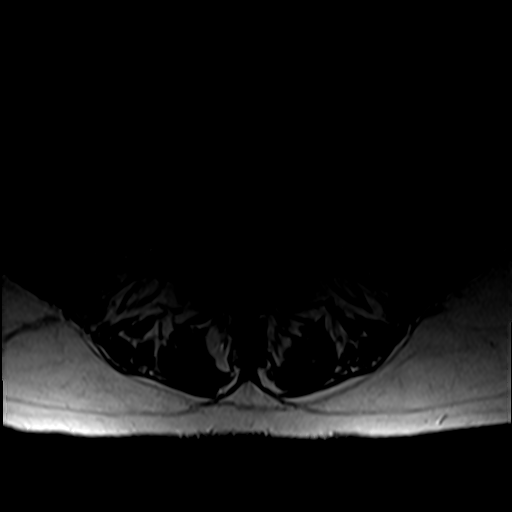
[im 11/37]
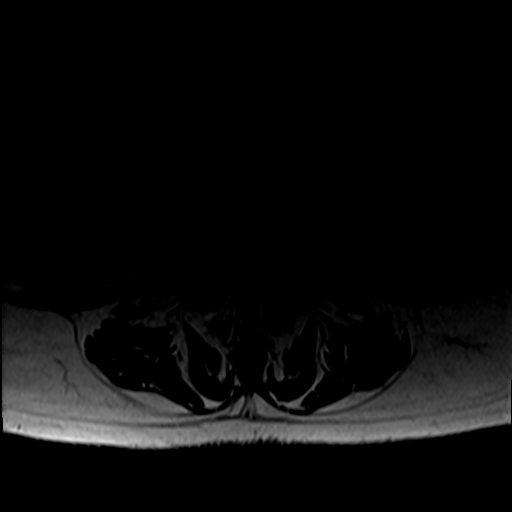
[im 16/37]
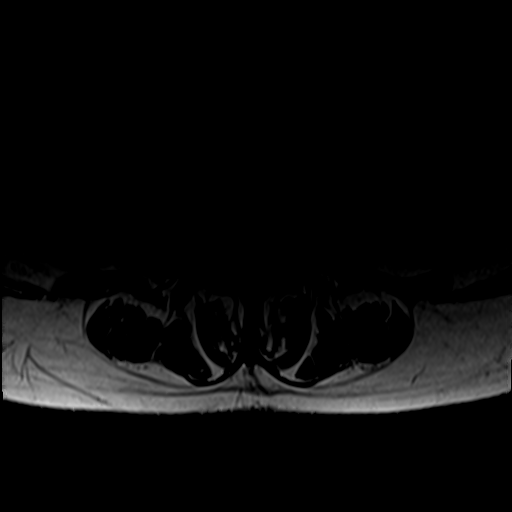
[im 19/37]
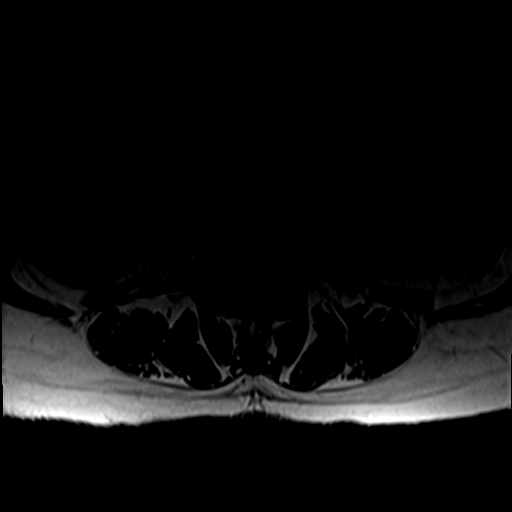
[im 31/37]
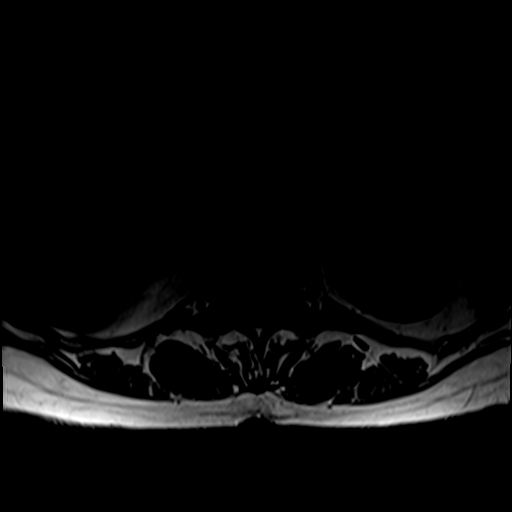

[27 of 48 positions shown; findings below may reference images not displayed]

FINDINGS: Segmentation: Standard segmentation is assumed. Inferior-most fully
formed intervertebral disc is labeled L5-S1.

Alignment: Stepwise grade 1 retrolisthesis of L1 on L2 and L2 on L3.
Grade 1 anterolisthesis of L4 on L5.

Vertebrae: Degenerative/discogenic endplate signal changes about the
L5-S1 disc. Otherwise, no marrow edema to suggest acute fracture
discitis/osteomyelitis. No suspicious bone lesions. Benign vertebral
venous malformation at T11.

Conus medullaris and cauda equina: Conus extends to the T12-L1
level. Conus appears normal.

Paraspinal and other soft tissues: Unremarkable.

Disc levels:

T12-L1: No significant disc protrusion, foraminal stenosis, or canal
stenosis.

L1-L2: Grade 1 anterolisthesis. Disc bulging and mild facet
arthropathy. Mild right foraminal and subarticular recess stenosis
without significant canal or left foraminal stenosis.

L2-L3: Grade 1 anterolisthesis. Mild disc bulging and facet
arthropathy. No significant canal or foraminal stenosis.

L3-L4: No significant disc protrusion, foraminal stenosis, or canal
stenosis.

L4-L5: Grade 1 anterolisthesis. Uncovering of the disc with moderate
bilateral facet arthropathy. Resulting mild canal stenosis without
significant foraminal stenosis.

L5-S1: Disc height loss. Large central and right subarticular disc
protrusion with inferior migration and impingement of the descending
right S1 nerve root. Overall canal stenosis is mild. No significant
foraminal stenosis.
IMPRESSION: 1. At L5-S1, large central/right subarticular disc protrusion with
inferior migration impingement of the descending right S1 nerve
roots.
2. At L4-L5, mild canal stenosis.

## 2023-03-06 DIAGNOSIS — H2512 Age-related nuclear cataract, left eye: Secondary | ICD-10-CM | POA: Diagnosis not present

## 2023-04-23 DIAGNOSIS — M5416 Radiculopathy, lumbar region: Secondary | ICD-10-CM | POA: Diagnosis not present

## 2023-05-16 DIAGNOSIS — M5416 Radiculopathy, lumbar region: Secondary | ICD-10-CM | POA: Diagnosis not present

## 2023-05-31 ENCOUNTER — Other Ambulatory Visit (HOSPITAL_COMMUNITY): Payer: Self-pay

## 2023-06-03 DIAGNOSIS — J40 Bronchitis, not specified as acute or chronic: Secondary | ICD-10-CM | POA: Diagnosis not present

## 2023-06-03 DIAGNOSIS — J329 Chronic sinusitis, unspecified: Secondary | ICD-10-CM | POA: Diagnosis not present

## 2023-06-04 ENCOUNTER — Ambulatory Visit (HOSPITAL_COMMUNITY)
Admission: RE | Admit: 2023-06-04 | Discharge: 2023-06-04 | Disposition: A | Discharge status: Home / Self Care | Payer: Medicare Other | Source: Ambulatory Visit | Attending: Internal Medicine | Admitting: Internal Medicine

## 2023-06-04 DIAGNOSIS — M810 Age-related osteoporosis without current pathological fracture: Secondary | ICD-10-CM | POA: Insufficient documentation

## 2023-06-04 MED ORDER — DENOSUMAB 60 MG/ML ~~LOC~~ SOSY
PREFILLED_SYRINGE | SUBCUTANEOUS | Status: AC
  Filled 2023-06-04: qty 1

## 2023-06-04 MED ORDER — DENOSUMAB 60 MG/ML ~~LOC~~ SOSY
60.0000 mg | PREFILLED_SYRINGE | Freq: Once | SUBCUTANEOUS | Status: AC
  Administered 2023-06-04: 60 mg via SUBCUTANEOUS

## 2023-06-08 DIAGNOSIS — H2512 Age-related nuclear cataract, left eye: Secondary | ICD-10-CM | POA: Diagnosis not present

## 2023-06-08 DIAGNOSIS — H2511 Age-related nuclear cataract, right eye: Secondary | ICD-10-CM | POA: Diagnosis not present

## 2023-06-13 DIAGNOSIS — M5416 Radiculopathy, lumbar region: Secondary | ICD-10-CM | POA: Diagnosis not present

## 2023-06-20 DIAGNOSIS — Z85828 Personal history of other malignant neoplasm of skin: Secondary | ICD-10-CM | POA: Diagnosis not present

## 2023-06-20 DIAGNOSIS — L309 Dermatitis, unspecified: Secondary | ICD-10-CM | POA: Diagnosis not present

## 2023-06-20 DIAGNOSIS — D2261 Melanocytic nevi of right upper limb, including shoulder: Secondary | ICD-10-CM | POA: Diagnosis not present

## 2023-06-20 DIAGNOSIS — D3612 Benign neoplasm of peripheral nerves and autonomic nervous system, upper limb, including shoulder: Secondary | ICD-10-CM | POA: Diagnosis not present

## 2023-07-06 DIAGNOSIS — Z1231 Encounter for screening mammogram for malignant neoplasm of breast: Secondary | ICD-10-CM | POA: Diagnosis not present

## 2023-07-13 DIAGNOSIS — H25011 Cortical age-related cataract, right eye: Secondary | ICD-10-CM | POA: Diagnosis not present

## 2023-07-13 DIAGNOSIS — H2511 Age-related nuclear cataract, right eye: Secondary | ICD-10-CM | POA: Diagnosis not present

## 2023-07-13 DIAGNOSIS — H25041 Posterior subcapsular polar age-related cataract, right eye: Secondary | ICD-10-CM | POA: Diagnosis not present

## 2023-10-05 DIAGNOSIS — H16143 Punctate keratitis, bilateral: Secondary | ICD-10-CM | POA: Diagnosis not present

## 2023-10-23 DIAGNOSIS — H04123 Dry eye syndrome of bilateral lacrimal glands: Secondary | ICD-10-CM | POA: Diagnosis not present

## 2023-12-24 DIAGNOSIS — K080 Exfoliation of teeth due to systemic causes: Secondary | ICD-10-CM | POA: Diagnosis not present

## 2024-01-10 DIAGNOSIS — R7301 Impaired fasting glucose: Secondary | ICD-10-CM | POA: Diagnosis not present

## 2024-01-10 DIAGNOSIS — E039 Hypothyroidism, unspecified: Secondary | ICD-10-CM | POA: Diagnosis not present

## 2024-01-10 DIAGNOSIS — E785 Hyperlipidemia, unspecified: Secondary | ICD-10-CM | POA: Diagnosis not present

## 2024-01-14 ENCOUNTER — Other Ambulatory Visit (HOSPITAL_COMMUNITY): Payer: Self-pay | Admitting: Internal Medicine

## 2024-01-14 ENCOUNTER — Telehealth (HOSPITAL_COMMUNITY): Payer: Self-pay

## 2024-01-14 NOTE — Telephone Encounter (Signed)
 Auth Submission: APPROVED Site of care: Site of care: [facility] Payer: BCBS Medicare Medication & CPT/J Code(s) submitted: Prolia  (Denosumab ) [id number] Diagnosis Code: M81.0 Route of submission (phone, fax, portal):  Phone # Fax # Auth type: Buy/Bill HB Units/visits requested: 60mg  q6 months x 2 doses Approval from: 01/09/24 to 05/07/24

## 2024-01-17 DIAGNOSIS — Z1331 Encounter for screening for depression: Secondary | ICD-10-CM | POA: Diagnosis not present

## 2024-01-17 DIAGNOSIS — Z23 Encounter for immunization: Secondary | ICD-10-CM | POA: Diagnosis not present

## 2024-01-17 DIAGNOSIS — Z0000 Encounter for general adult medical examination without abnormal findings: Secondary | ICD-10-CM | POA: Diagnosis not present

## 2024-01-17 DIAGNOSIS — I10 Essential (primary) hypertension: Secondary | ICD-10-CM | POA: Diagnosis not present

## 2024-01-17 DIAGNOSIS — Z1339 Encounter for screening examination for other mental health and behavioral disorders: Secondary | ICD-10-CM | POA: Diagnosis not present

## 2024-01-23 DIAGNOSIS — H04123 Dry eye syndrome of bilateral lacrimal glands: Secondary | ICD-10-CM | POA: Diagnosis not present

## 2024-01-24 ENCOUNTER — Ambulatory Visit (HOSPITAL_COMMUNITY)
Admission: RE | Admit: 2024-01-24 | Discharge: 2024-01-24 | Disposition: A | Discharge status: Home / Self Care | Source: Ambulatory Visit | Attending: Internal Medicine | Admitting: Internal Medicine

## 2024-01-24 VITALS — BP 104/72 | HR 66 | Temp 98.1°F | Resp 18

## 2024-01-24 DIAGNOSIS — M810 Age-related osteoporosis without current pathological fracture: Secondary | ICD-10-CM | POA: Insufficient documentation

## 2024-01-24 MED ORDER — DENOSUMAB 60 MG/ML ~~LOC~~ SOSY
60.0000 mg | PREFILLED_SYRINGE | Freq: Once | SUBCUTANEOUS | Status: AC
  Administered 2024-01-24: 60 mg via SUBCUTANEOUS

## 2024-01-24 MED ORDER — DENOSUMAB 60 MG/ML ~~LOC~~ SOSY
PREFILLED_SYRINGE | SUBCUTANEOUS | Status: AC
  Filled 2024-01-24: qty 1

## 2024-07-24 ENCOUNTER — Encounter (HOSPITAL_COMMUNITY)
